# Patient Record
Sex: Female | Born: 1985
Health system: Southern US, Community
[De-identification: ages and names within clinical notes are randomized; demographics above are authoritative.]

## PROBLEM LIST (undated history)

## (undated) DIAGNOSIS — F329 Major depressive disorder, single episode, unspecified: Secondary | ICD-10-CM

## (undated) DIAGNOSIS — F32A Depression, unspecified: Secondary | ICD-10-CM

## (undated) DIAGNOSIS — Z8489 Family history of other specified conditions: Secondary | ICD-10-CM

## (undated) DIAGNOSIS — F1310 Sedative, hypnotic or anxiolytic abuse, uncomplicated: Secondary | ICD-10-CM

## (undated) DIAGNOSIS — Q760 Spina bifida occulta: Secondary | ICD-10-CM

## (undated) DIAGNOSIS — J45909 Unspecified asthma, uncomplicated: Secondary | ICD-10-CM

## (undated) DIAGNOSIS — F419 Anxiety disorder, unspecified: Secondary | ICD-10-CM

## (undated) DIAGNOSIS — K219 Gastro-esophageal reflux disease without esophagitis: Secondary | ICD-10-CM

## (undated) DIAGNOSIS — Q068 Other specified congenital malformations of spinal cord: Secondary | ICD-10-CM

## (undated) HISTORY — PX: SPINE SURGERY: SHX786

## (undated) HISTORY — DX: Other specified congenital malformations of spinal cord: Q06.8

## (undated) HISTORY — DX: Sedative, hypnotic or anxiolytic abuse, uncomplicated: F13.10

## (undated) HISTORY — DX: Spina bifida occulta: Q76.0

## (undated) HISTORY — DX: Gastro-esophageal reflux disease without esophagitis: K21.9

---
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
## 2007-04-07 ENCOUNTER — Emergency Department (HOSPITAL_COMMUNITY): Admission: EM | Admit: 2007-04-07 | Discharge: 2007-04-07 | Payer: Self-pay | Admitting: Family Medicine

## 2007-08-13 ENCOUNTER — Emergency Department (HOSPITAL_COMMUNITY): Admission: EM | Admit: 2007-08-13 | Discharge: 2007-08-13 | Payer: Self-pay | Admitting: Emergency Medicine

## 2008-01-06 ENCOUNTER — Emergency Department (HOSPITAL_COMMUNITY): Admission: EM | Admit: 2008-01-06 | Discharge: 2008-01-06 | Payer: Self-pay | Admitting: Emergency Medicine

## 2008-01-08 ENCOUNTER — Emergency Department (HOSPITAL_COMMUNITY): Admission: EM | Admit: 2008-01-08 | Discharge: 2008-01-08 | Payer: Self-pay | Admitting: Emergency Medicine

## 2009-03-17 ENCOUNTER — Emergency Department (HOSPITAL_COMMUNITY): Admission: EM | Admit: 2009-03-17 | Discharge: 2009-03-17 | Payer: Self-pay | Admitting: Emergency Medicine

## 2011-01-23 LAB — RAPID STREP SCREEN (MED CTR MEBANE ONLY): Streptococcus, Group A Screen (Direct): NEGATIVE

## 2011-07-26 LAB — URINALYSIS, ROUTINE W REFLEX MICROSCOPIC
Bilirubin Urine: NEGATIVE
Ketones, ur: NEGATIVE
Nitrite: NEGATIVE
Protein, ur: NEGATIVE
Urobilinogen, UA: 0.2

## 2011-07-26 LAB — PREGNANCY, URINE: Preg Test, Ur: NEGATIVE

## 2011-07-26 LAB — RPR: RPR Ser Ql: NONREACTIVE

## 2011-07-26 LAB — GC/CHLAMYDIA PROBE AMP, GENITAL: Chlamydia, DNA Probe: NEGATIVE

## 2012-12-10 ENCOUNTER — Other Ambulatory Visit (HOSPITAL_COMMUNITY): Payer: Self-pay | Admitting: Nurse Practitioner

## 2012-12-10 DIAGNOSIS — R109 Unspecified abdominal pain: Secondary | ICD-10-CM

## 2012-12-12 ENCOUNTER — Ambulatory Visit (HOSPITAL_COMMUNITY)
Admission: RE | Admit: 2012-12-12 | Discharge: 2012-12-12 | Disposition: A | Discharge status: Home / Self Care | Payer: Self-pay | Source: Ambulatory Visit | Attending: Obstetrics and Gynecology | Admitting: Obstetrics and Gynecology

## 2012-12-12 ENCOUNTER — Ambulatory Visit (HOSPITAL_COMMUNITY): Payer: Self-pay

## 2012-12-12 ENCOUNTER — Other Ambulatory Visit (HOSPITAL_COMMUNITY): Payer: Self-pay | Admitting: Nurse Practitioner

## 2012-12-12 DIAGNOSIS — R109 Unspecified abdominal pain: Secondary | ICD-10-CM | POA: Insufficient documentation

## 2012-12-12 DIAGNOSIS — N949 Unspecified condition associated with female genital organs and menstrual cycle: Secondary | ICD-10-CM | POA: Insufficient documentation

## 2012-12-19 ENCOUNTER — Encounter (HOSPITAL_COMMUNITY): Payer: Self-pay | Admitting: *Deleted

## 2012-12-19 ENCOUNTER — Emergency Department (HOSPITAL_COMMUNITY)
Admission: EM | Admit: 2012-12-19 | Discharge: 2012-12-19 | Disposition: A | Discharge status: Home / Self Care | Payer: Self-pay | Attending: Emergency Medicine | Admitting: Emergency Medicine

## 2012-12-19 DIAGNOSIS — F411 Generalized anxiety disorder: Secondary | ICD-10-CM | POA: Insufficient documentation

## 2012-12-19 DIAGNOSIS — H938X9 Other specified disorders of ear, unspecified ear: Secondary | ICD-10-CM | POA: Insufficient documentation

## 2012-12-19 DIAGNOSIS — L089 Local infection of the skin and subcutaneous tissue, unspecified: Secondary | ICD-10-CM | POA: Insufficient documentation

## 2012-12-19 DIAGNOSIS — Z79899 Other long term (current) drug therapy: Secondary | ICD-10-CM | POA: Insufficient documentation

## 2012-12-19 DIAGNOSIS — H60392 Other infective otitis externa, left ear: Secondary | ICD-10-CM

## 2012-12-19 DIAGNOSIS — F329 Major depressive disorder, single episode, unspecified: Secondary | ICD-10-CM | POA: Insufficient documentation

## 2012-12-19 DIAGNOSIS — F3289 Other specified depressive episodes: Secondary | ICD-10-CM | POA: Insufficient documentation

## 2012-12-19 HISTORY — DX: Major depressive disorder, single episode, unspecified: F32.9

## 2012-12-19 HISTORY — DX: Anxiety disorder, unspecified: F41.9

## 2012-12-19 HISTORY — DX: Depression, unspecified: F32.A

## 2012-12-19 MED ORDER — SULFAMETHOXAZOLE-TRIMETHOPRIM 800-160 MG PO TABS: 1.0000 | ORAL_TABLET | Freq: Two times a day (BID) | ORAL | Status: DC

## 2012-12-19 MED ORDER — DICLOFENAC SODIUM 75 MG PO TBEC: 75.0000 mg | DELAYED_RELEASE_TABLET | Freq: Two times a day (BID) | ORAL | Status: AC

## 2012-12-19 MED ORDER — KETOROLAC TROMETHAMINE 10 MG PO TABS
10.0000 mg | ORAL_TABLET | Freq: Once | ORAL | Status: AC
  Administered 2012-12-19: 10 mg via ORAL
  Filled 2012-12-19: qty 1

## 2012-12-19 MED ORDER — AMOXICILLIN 500 MG PO CAPS: 500.0000 mg | ORAL_CAPSULE | Freq: Three times a day (TID) | ORAL | Status: DC

## 2012-12-19 MED ORDER — SULFAMETHOXAZOLE-TMP DS 800-160 MG PO TABS
1.0000 | ORAL_TABLET | Freq: Once | ORAL | Status: AC
  Administered 2012-12-19: 1 via ORAL
  Filled 2012-12-19: qty 1

## 2012-12-19 MED ORDER — CEFTRIAXONE SODIUM 1 G IJ SOLR
1.0000 g | Freq: Once | INTRAMUSCULAR | Status: AC
  Administered 2012-12-19: 1 g via INTRAMUSCULAR
  Filled 2012-12-19: qty 10

## 2012-12-19 MED ORDER — ONDANSETRON HCL 4 MG PO TABS
4.0000 mg | ORAL_TABLET | Freq: Once | ORAL | Status: AC
  Administered 2012-12-19: 4 mg via ORAL
  Filled 2012-12-19: qty 1

## 2012-12-19 NOTE — ED Notes (Signed)
Pain/swelling to left earlobe x 2 days.

## 2012-12-19 NOTE — ED Provider Notes (Signed)
History     CSN: [id number]  Arrival date & time 12/19/12  1624   First MD Initiated Contact with Patient 12/19/12 1747      Chief Complaint  Patient presents with  . Otalgia    (Consider location/radiation/quality/duration/timing/severity/associated sxs/prior treatment) HPI Comments: Patient states that she began to notice some swelling of her left earlobe approximately March 3. On March 4 she took an ear ring post out of her ear as well as the backing. She attempted to put it back in on the fifth, and noted even more swelling today. She states she has not noticed any drainage, but has redness and swelling and pain of the earlobe. She does not feel that she has lost a back in the earlobe. She's not had fever or chills related to this.  The history is provided by the patient.    Past Medical History  Diagnosis Date  . Depression   . Anxiety     History reviewed. No pertinent past surgical history.  No family history on file.  History  Substance Use Topics  . Smoking status: Never Smoker   . Smokeless tobacco: Not on file  . Alcohol Use: Yes     Comment: occasional    OB History   Grav Para Term Preterm Abortions TAB SAB Ect Mult Living                  Review of Systems  Constitutional: Negative for activity change.       All ROS Neg except as noted in HPI  HENT: Positive for ear pain. Negative for nosebleeds and neck pain.   Eyes: Negative for photophobia and discharge.  Respiratory: Negative for cough, shortness of breath and wheezing.   Cardiovascular: Negative for chest pain and palpitations.  Gastrointestinal: Negative for abdominal pain and blood in stool.  Genitourinary: Negative for dysuria, frequency and hematuria.  Musculoskeletal: Negative for back pain and arthralgias.  Skin: Negative.   Neurological: Negative for dizziness, seizures and speech difficulty.  Psychiatric/Behavioral: Negative for hallucinations and confusion. The patient is  nervous/anxious.        Depression    Allergies  Review of patient's allergies indicates no known allergies.  Home Medications   Current Outpatient Rx  Name  Route  Sig  Dispense  Refill  . ALPRAZolam (XANAX) 0.5 MG tablet   Oral   Take 0.5 mg by mouth 5 (five) times daily.         Marland Kitchen FLUoxetine (PROZAC) 20 MG capsule   Oral   Take 20 mg by mouth daily.         . phentermine (ADIPEX-P) 37.5 MG tablet   Oral   Take 37.5 mg by mouth daily.         Marland Kitchen amoxicillin (AMOXIL) 500 MG capsule   Oral   Take 1 capsule (500 mg total) by mouth 3 (three) times daily.   21 capsule   0   . diclofenac (VOLTAREN) 75 MG EC tablet   Oral   Take 1 tablet (75 mg total) by mouth 2 (two) times daily.   12 tablet   0   . sulfamethoxazole-trimethoprim (SEPTRA DS) 800-160 MG per tablet   Oral   Take 1 tablet by mouth every 12 (twelve) hours.   14 tablet   0     BP 128/80  Pulse 80  Temp(Src) 98 F (36.7 C) (Oral)  Resp 16  Ht 5\' 7"  (1.702 m)  Wt 230 lb (104.327 kg)  BMI 36.01 kg/m2  SpO2 98%  LMP 11/22/2012  Physical Exam  Nursing note and vitals reviewed. Constitutional: She is oriented to person, place, and time. She appears well-developed and well-nourished.  Non-toxic appearance.  HENT:  Head: Normocephalic.  Right Ear: Tympanic membrane and external ear normal.  Left Ear: Tympanic membrane and external ear normal.  There is redness and swelling of the left earlobe. There is a small amount of crusting on the posterior portion of the lobe. The patient is reluctant to examination, but I do not feel foreign body in the swollen portion of the lobe. There is no swelling or redness behind the ear. There are some swollen nodes in the cervical chain just under the earlobe and extending into the midneck. There is no facial asymmetry. There is no pain or swelling anterior to the left year.  Eyes: EOM and lids are normal. Pupils are equal, round, and reactive to light.  Neck: Normal  range of motion. Neck supple. Carotid bruit is not present.  Cardiovascular: Normal rate, regular rhythm, normal heart sounds, intact distal pulses and normal pulses.   Pulmonary/Chest: Breath sounds normal. No respiratory distress.  Abdominal: Soft. Bowel sounds are normal. There is no tenderness. There is no guarding.  Musculoskeletal: Normal range of motion.  Lymphadenopathy:       Head (right side): No submandibular adenopathy present.       Head (left side): No submandibular adenopathy present.    She has no cervical adenopathy.  Neurological: She is alert and oriented to person, place, and time. She has normal strength. No cranial nerve deficit or sensory deficit.  Skin: Skin is warm and dry.  Psychiatric: She has a normal mood and affect. Her speech is normal.    ED Course  Procedures (including critical care time) PUlse Ox 98% on room air. WNL by my interpretation. Labs Reviewed - No data to display No results found.   1. Infection of skin of ear lobe, left       MDM  . Patient has an infection of the left ear lobe. She was treated in the emergency department with intramuscular Rocephin and Septra. She's given a prescription for all amoxicillin and Septra. She's also given a prescription for diclofenac to assist with swelling and discomfort. The patient is to use warm compresses to the ear. She is advised to see her primary physician, or return to the emergency department if not improving.      Kathie Dike, PA-C 12/19/12 [place]

## 2012-12-19 NOTE — ED Provider Notes (Signed)
Medical screening examination/treatment/procedure(s) were performed by non-physician practitioner and as supervising physician I was immediately available for consultation/collaboration.   Joseph L Zammit, MD 12/19/12 2316 

## 2012-12-19 NOTE — ED Notes (Signed)
Pt had been examined by PA prior to being seen by me.  Alert, NAD

## 2014-12-16 ENCOUNTER — Ambulatory Visit (INDEPENDENT_AMBULATORY_CARE_PROVIDER_SITE_OTHER): Payer: BLUE CROSS/BLUE SHIELD | Admitting: Family Medicine

## 2014-12-16 VITALS — BP 130/80 | HR 86 | Temp 98.7°F | Ht 67.0 in | Wt 249.5 lb

## 2014-12-16 DIAGNOSIS — Z3009 Encounter for other general counseling and advice on contraception: Secondary | ICD-10-CM

## 2014-12-16 MED ORDER — NORETHIN-ETH ESTRAD TRIPHASIC 0.5/0.75/1-35 MG-MCG PO TABS: 1.0000 | ORAL_TABLET | Freq: Every day | ORAL | Status: DC

## 2014-12-16 NOTE — Progress Notes (Signed)
 @[facility] @  This chart was scribed for Hannah SidleKurt Suri Tafolla, MD by Murriel HopperAlec Bankhead, ED Scribe. The patient's care was started at 6:02 PM.   Patient ID: Hannah Lambert MRN: [id number], DOB: August 14, 1986, 29 y.o. Date of Encounter: 12/16/2014, 6:02 PM  Primary Physician: Myriam JacobsonELLIS, DONNA, FNP  Chief Complaint:  Chief Complaint  Patient presents with  . Establish Care    re-establish care  . Contraception    Wants to get started on birth control & have a pap done if needed     HPI: 29 y.o. year old female with history below presents requesting to be prescribed birth control. Pt states she has taken birth control before, and states ortho-tricycline works well. Pt states that she has had high blood pressure in the past, but denies headaches, blood clots, or pain in her legs. 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 Pt notes that she had a pap test two years ago.    Past Medical History  Diagnosis Date  . Depression   . Anxiety      Home Meds: Prior to Admission medications   Medication Sig Start Date End Date Taking? Authorizing Provider  busPIRone (BUSPAR) 5 MG tablet Take 5 mg by mouth 3 (three) times daily.   Yes Historical Provider, MD  diazepam (VALIUM) 5 MG tablet Take 5 mg by mouth daily.   Yes Historical Provider, MD  FLUoxetine (PROZAC) 20 MG capsule Take 20 mg by mouth daily.    Historical Provider, MD    Allergies: No Known Allergies  History   Social History  . Marital Status: Single    Spouse Name: N/A  . Number of Children: N/A  . Years of Education: N/A   Occupational History  . Not on file.   Social History Main Topics  . Smoking status: Current Every Day Smoker  . Smokeless tobacco: Not on file     Comment: 1/4 ppd  . Alcohol Use: 0.0 oz/week    0 Standard drinks or equivalent per week     Comment: occasional  . Drug Use: No  . Sexual Activity: Yes    Birth Control/ Protection: None   Other Topics Concern  . Not on file   Social History  Narrative     Review of Systems: Constitutional: negative for chills, fever, night sweats, weight changes, or fatigue  HEENT: negative for vision changes, hearing loss, congestion, rhinorrhea, ST, epistaxis, or sinus pressure Cardiovascular: negative for chest pain or palpitations Respiratory: negative for hemoptysis, wheezing, shortness of breath, or cough Abdominal: negative for abdominal pain, nausea, vomiting, diarrhea, or constipation Dermatological: negative for rash Neurologic: negative for headache, dizziness, or syncope All other systems reviewed and are otherwise negative with the exception to those above and in the HPI.   Physical Exam: Blood pressure 130/80, pulse 86, temperature 98.7 F (37.1 C), temperature source Oral, height 5\' 7"  (1.702 m), weight 249 lb 8 oz (113.172 kg), last menstrual period 11/22/2014, SpO2 98 %., Body mass index is 39.07 kg/(m^2). General: Well developed, well nourished, in no acute distress. Head: Normocephalic, atraumatic, eyes without discharge, sclera non-icteric, nares are without discharge. Bilateral auditory canals clear, TM's are without perforation, pearly grey and translucent with reflective cone of light bilaterally. Oral cavity moist, posterior pharynx without exudate, erythema, peritonsillar abscess, or post nasal drip.  Neck: Supple. No thyromegaly. Full ROM. No lymphadenopathy. Lungs: Clear bilaterally to auscultation without wheezes, rales, or rhonchi. Breathing is unlabored. Heart: RRR with S1 S2. No murmurs, rubs,  or gallops appreciated. Abdomen: Soft, non-tender, non-distended with normoactive bowel sounds. No hepatomegaly. No rebound/guarding. No obvious abdominal masses. Msk:  Strength and tone normal for age. Extremities/Skin: Warm and dry. No clubbing or cyanosis. No edema. No rashes or suspicious lesions. Neuro: Alert and oriented X 3. Moves all extremities spontaneously. Gait is normal. CNII-XII grossly in tact. Psych:   Responds to questions appropriately with a normal affect.      ASSESSMENT AND PLAN:  29 y.o. year old female with    Signed, Hannah Sidle, MD 12/16/2014 6:02 PM  This chart was scribed in my presence and reviewed by me personally.    ICD-9-CM ICD-10-CM   1. Encounter for other general counseling or advice on contraception V25.09 Z30.09 norethindrone-ethinyl estradiol (ORTHO-NOVUM 7/7/7, 28,) 0.5/0.75/1-35 MG-MCG tablet     DISCONTINUED: norethindrone-ethinyl estradiol (ORTHO-NOVUM 7/7/7, 28,) 0.5/0.75/1-35 MG-MCG tablet     Signed, Hannah Sidle, MD

## 2014-12-16 NOTE — Patient Instructions (Signed)
Contraception Choices Contraception (birth control) is the use of any methods or devices to prevent pregnancy. Below are some methods to help avoid pregnancy. HORMONAL METHODS   Contraceptive implant. This is a thin, plastic tube containing progesterone hormone. It does not contain estrogen hormone. Your health care provider inserts the tube in the inner part of the upper arm. The tube can remain in place for up to 3 years. After 3 years, the implant must be removed. The implant prevents the ovaries from releasing an egg (ovulation), thickens the cervical mucus to prevent sperm from entering the uterus, and thins the lining of the inside of the uterus.  Progesterone-only injections. These injections are given every 3 months by your health care provider to prevent pregnancy. This synthetic progesterone hormone stops the ovaries from releasing eggs. It also thickens cervical mucus and changes the uterine lining. This makes it harder for sperm to survive in the uterus.  Birth control pills. These pills contain estrogen and progesterone hormone. They work by preventing the ovaries from releasing eggs (ovulation). They also cause the cervical mucus to thicken, preventing the sperm from entering the uterus. Birth control pills are prescribed by a health care provider.Birth control pills can also be used to treat heavy periods.  Minipill. This type of birth control pill contains only the progesterone hormone. They are taken every day of each month and must be prescribed by your health care provider.  Birth control patch. The patch contains hormones similar to those in birth control pills. It must be changed once a week and is prescribed by a health care provider.  Vaginal ring. The ring contains hormones similar to those in birth control pills. It is left in the vagina for 3 weeks, removed for 1 week, and then a new one is put back in place. The patient must be comfortable inserting and removing the ring  from the vagina.A health care provider's prescription is necessary.  Emergency contraception. Emergency contraceptives prevent pregnancy after unprotected sexual intercourse. This pill can be taken right after sex or up to 5 days after unprotected sex. It is most effective the sooner you take the pills after having sexual intercourse. Most emergency contraceptive pills are available without a prescription. Check with your pharmacist. Do not use emergency contraception as your only form of birth control. BARRIER METHODS   Female condom. This is a thin sheath (latex or rubber) that is worn over the penis during sexual intercourse. It can be used with spermicide to increase effectiveness.  Female condom. This is a soft, loose-fitting sheath that is put into the vagina before sexual intercourse.  Diaphragm. This is a soft, latex, dome-shaped barrier that must be fitted by a health care provider. It is inserted into the vagina, along with a spermicidal jelly. It is inserted before intercourse. The diaphragm should be left in the vagina for 6 to 8 hours after intercourse.  Cervical cap. This is a round, soft, latex or plastic cup that fits over the cervix and must be fitted by a health care provider. The cap can be left in place for up to 48 hours after intercourse.  Sponge. This is a soft, circular piece of polyurethane foam. The sponge has spermicide in it. It is inserted into the vagina after wetting it and before sexual intercourse.  Spermicides. These are chemicals that kill or block sperm from entering the cervix and uterus. They come in the form of creams, jellies, suppositories, foam, or tablets. They do not require a   prescription. They are inserted into the vagina with an applicator before having sexual intercourse. The process must be repeated every time you have sexual intercourse. INTRAUTERINE CONTRACEPTION  Intrauterine device (IUD). This is a T-shaped device that is put in a woman's uterus  during a menstrual period to prevent pregnancy. There are 2 types:  Copper IUD. This type of IUD is wrapped in copper wire and is placed inside the uterus. Copper makes the uterus and fallopian tubes produce a fluid that kills sperm. It can stay in place for 10 years.  Hormone IUD. This type of IUD contains the hormone progestin (synthetic progesterone). The hormone thickens the cervical mucus and prevents sperm from entering the uterus, and it also thins the uterine lining to prevent implantation of a fertilized egg. The hormone can weaken or kill the sperm that get into the uterus. It can stay in place for 3-5 years, depending on which type of IUD is used. PERMANENT METHODS OF CONTRACEPTION  Female tubal ligation. This is when the woman's fallopian tubes are surgically sealed, tied, or blocked to prevent the egg from traveling to the uterus.  Hysteroscopic sterilization. This involves placing a small coil or insert into each fallopian tube. Your doctor uses a technique called hysteroscopy to do the procedure. The device causes scar tissue to form. This results in permanent blockage of the fallopian tubes, so the sperm cannot fertilize the egg. It takes about 3 months after the procedure for the tubes to become blocked. You must use another form of birth control for these 3 months.  Female sterilization. This is when the female has the tubes that carry sperm tied off (vasectomy).This blocks sperm from entering the vagina during sexual intercourse. After the procedure, the man can still ejaculate fluid (semen). NATURAL PLANNING METHODS  Natural family planning. This is not having sexual intercourse or using a barrier method (condom, diaphragm, cervical cap) on days the woman could become pregnant.  Calendar method. This is keeping track of the length of each menstrual cycle and identifying when you are fertile.  Ovulation method. This is avoiding sexual intercourse during ovulation.  Symptothermal  method. This is avoiding sexual intercourse during ovulation, using a thermometer and ovulation symptoms.  Post-ovulation method. This is timing sexual intercourse after you have ovulated. Regardless of which type or method of contraception you choose, it is important that you use condoms to protect against the transmission of sexually transmitted infections (STIs). Talk with your health care provider about which form of contraception is most appropriate for you. Document Released: 10/02/2005 Document Revised: 10/07/2013 Document Reviewed: 03/27/2013 Exit[employer] Patient Information 2015 [employer]. This information is not intended to replace advice given to you by your health care provider. Make sure you discuss any questions you have with your health care provider.  

## 2014-12-21 ENCOUNTER — Ambulatory Visit (INDEPENDENT_AMBULATORY_CARE_PROVIDER_SITE_OTHER): Payer: BLUE CROSS/BLUE SHIELD | Admitting: Family Medicine

## 2014-12-21 VITALS — BP 130/84 | HR 80 | Temp 98.1°F | Resp 16 | Ht 67.0 in | Wt 253.0 lb

## 2014-12-21 DIAGNOSIS — E663 Overweight: Secondary | ICD-10-CM | POA: Diagnosis not present

## 2014-12-21 DIAGNOSIS — R112 Nausea with vomiting, unspecified: Secondary | ICD-10-CM

## 2014-12-21 DIAGNOSIS — R197 Diarrhea, unspecified: Secondary | ICD-10-CM

## 2014-12-21 LAB — COMPREHENSIVE METABOLIC PANEL
ALT: 30 U/L (ref 0–35)
AST: 15 U/L (ref 0–37)
Albumin: 4.1 g/dL (ref 3.5–5.2)
Alkaline Phosphatase: 63 U/L (ref 39–117)
BUN: 11 mg/dL (ref 6–23)
CO2: 26 meq/L (ref 19–32)
Calcium: 8.7 mg/dL (ref 8.4–10.5)
Chloride: 105 mEq/L (ref 96–112)
Creat: 0.71 mg/dL (ref 0.50–1.10)
GLUCOSE: 90 mg/dL (ref 70–99)
POTASSIUM: 4.2 meq/L (ref 3.5–5.3)
SODIUM: 138 meq/L (ref 135–145)
Total Bilirubin: 0.2 mg/dL (ref 0.2–1.2)
Total Protein: 6.4 g/dL (ref 6.0–8.3)

## 2014-12-21 LAB — POCT CBC
Granulocyte percent: 68.8 %G (ref 37–80)
HEMATOCRIT: 41.2 % (ref 37.7–47.9)
HEMOGLOBIN: 13.2 g/dL (ref 12.2–16.2)
Lymph, poc: 3.4 (ref 0.6–3.4)
MCH: 28.6 pg (ref 27–31.2)
MCHC: 32.1 g/dL (ref 31.8–35.4)
MCV: 89.3 fL (ref 80–97)
MID (cbc): 1 — AB (ref 0–0.9)
MPV: 7.6 fL (ref 0–99.8)
POC Granulocyte: 9.8 — AB (ref 2–6.9)
POC LYMPH %: 24 % (ref 10–50)
POC MID %: 7.2 %M (ref 0–12)
Platelet Count, POC: 282 10*3/uL (ref 142–424)
RBC: 4.62 M/uL (ref 4.04–5.48)
RDW, POC: 14.7 %
WBC: 14.3 10*3/uL — AB (ref 4.6–10.2)

## 2014-12-21 LAB — POCT URINE PREGNANCY: PREG TEST UR: NEGATIVE

## 2014-12-21 MED ORDER — ONDANSETRON 4 MG PO TBDP: 4.0000 mg | ORAL_TABLET | Freq: Three times a day (TID) | ORAL | Status: DC | PRN

## 2014-12-21 NOTE — Patient Instructions (Signed)
Rest and drink plenty of fluids.  Use the zofran as needed for nausea.  Stick to bland foods such as bananas, dry toast, applesauce If you are not improving over the next couple of days please let me know- Sooner if worse.  I will be in touch with the rest of your labs asap

## 2014-12-21 NOTE — Progress Notes (Signed)
[facility] [address] [place] [address] [phone]  Date:  12/21/2014   Name:  BOBBY RAGAN   DOB:  October 12, 1986   MRN:  [id number]  PCP:  Myriam Jacobson, FNP    Chief Complaint: Emesis and Diarrhea   History of Present Illness:  LOANY NEUROTH is a 29 y.o. very pleasant female patient who presents with the following:  Here today with illness since yesterday.  She noted onset of nausea, vomiting and diarrhea that started around 0400 yesterday am.  She is not aware of any suspicious foods.  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   She is feeling a bit better overall today.   She was able to eat breakfast this am, and she is drinking gatorade She last vomited last night around 11pm She is still having diarrhea.   No blood in her diarrhea or vomit.  She is not having abdominal pain She did have chills yesterday and thought that she might have had a fever.  She took an aleve and was able to sleep.    Just started OCP yesterday when her period began  Patient Active Problem List   Diagnosis Date Noted  . Encounter for other general counseling or advice on contraception 12/16/2014    Past Medical History  Diagnosis Date  . Depression   . Anxiety     No past surgical history on file.  History  Substance Use Topics  . Smoking status: Current Every Day Smoker  . Smokeless tobacco: Not on file     Comment: 1/4 ppd  . Alcohol Use: 0.0 oz/week    0 Standard drinks or equivalent per week     Comment: occasional    Family History  Problem Relation Age of Onset  . Hypertension Mother   . Hypertension Father   . Hypertension Maternal Grandmother   . Mental illness Maternal Grandmother   . Mental illness Paternal Grandmother     No Known Allergies  Medication list has been reviewed and updated.  Current Outpatient Prescriptions on File Prior to Visit  Medication Sig Dispense Refill  . busPIRone (BUSPAR)  5 MG tablet Take 5 mg by mouth 3 (three) times daily.    . diazepam (VALIUM) 5 MG tablet Take 5 mg by mouth daily.    Marland Kitchen FLUoxetine (PROZAC) 20 MG capsule Take 20 mg by mouth daily.    . norethindrone-ethinyl estradiol (ORTHO-NOVUM 7/7/7, 28,) 0.5/0.75/1-35 MG-MCG tablet Take 1 tablet by mouth daily. 1 Package 11   No current facility-administered medications on file prior to visit.    Review of Systems:  As per HPI- otherwise negative.   Physical Examination: Filed Vitals:   12/21/14 1536  BP: 130/84  Pulse: 80  Temp: 98.1 F (36.7 C)  Resp: 16   Filed Vitals:   12/21/14 1536  Height:  (1.702 m)  Weight: 253 lb (114.76 kg)   Body mass index is 39.62 kg/(m^2). Ideal Body Weight: Weight in (lb) to have BMI = 25: 159.3  GEN: WDWN, NAD, Non-toxic, A & O x 3, overweight, looks well HEENT: Atraumatic, Normocephalic. Neck supple. No masses, No LAD. Ears and Nose: No external deformity. CV: RRR, No M/G/R. No JVD. No thrill. No extra heart sounds. PULM: CTA B, no wheezes, crackles, rhonchi. No retractions. No resp. distress. No accessory muscle use. ABD: S, NT, ND, +BS. No rebound. No HSM.  Benign exam EXTR: No  c/c/e NEURO Normal gait.  PSYCH: Normally interactive. Conversant. Not depressed or anxious appearing.  Calm demeanor.   Results for orders placed or performed in visit on 12/21/14  POCT CBC  Result Value Ref Range   WBC 14.3 (A) 4.6 - 10.2 K/uL   Lymph, poc 3.4 0.6 - 3.4   POC LYMPH PERCENT 24.0 10 - 50 %L   MID (cbc) 1.0 (A) 0 - 0.9   POC MID % 7.2 0 - 12 %M   POC Granulocyte 9.8 (A) 2 - 6.9   Granulocyte percent 68.8 37 - 80 %G   RBC 4.62 4.04 - 5.48 M/uL   Hemoglobin 13.2 12.2 - 16.2 g/dL   HCT, POC 16.141.2 09.637.7 - 47.9 %   MCV 89.3 80 - 97 fL   MCH, POC 28.6 27 - 31.2 pg   MCHC 32.1 31.8 - 35.4 g/dL   RDW, POC 04.514.7 %   Platelet Count, POC 282 142 - 424 K/uL   MPV 7.6 0 - 99.8 fL  POCT urine pregnancy  Result Value Ref Range   Preg Test, Ur Negative      Assessment and Plan: Non-intractable vomiting with nausea, vomiting of unspecified type - Plan: POCT CBC, Comprehensive metabolic panel, POCT urine pregnancy, ondansetron (ZOFRAN ODT) 4 MG disintegrating tablet, CANCELED: POCT UA - Microscopic Only  Diarrhea  Likely viral gastroenteritis.  Treat with zofran as needed, supportive care.  She will follow-up if not better, check CMP and will be in touch with her.  She does not appear dehydrated and is tolerating PO so did not give IV fluids today  Signed Abbe AmsterdamJessica Rether Rison, MD

## 2014-12-22 ENCOUNTER — Encounter: Payer: Self-pay | Admitting: Family Medicine

## 2015-01-04 ENCOUNTER — Ambulatory Visit (INDEPENDENT_AMBULATORY_CARE_PROVIDER_SITE_OTHER): Payer: BLUE CROSS/BLUE SHIELD | Admitting: Physician Assistant

## 2015-01-04 VITALS — BP 122/82 | HR 74 | Temp 98.1°F | Resp 16 | Ht 67.75 in | Wt 252.4 lb

## 2015-01-04 DIAGNOSIS — Z87898 Personal history of other specified conditions: Secondary | ICD-10-CM

## 2015-01-04 DIAGNOSIS — Z87448 Personal history of other diseases of urinary system: Secondary | ICD-10-CM | POA: Diagnosis not present

## 2015-01-04 LAB — POCT URINALYSIS DIPSTICK
BILIRUBIN UA: NEGATIVE
Glucose, UA: NEGATIVE
KETONES UA: NEGATIVE
NITRITE UA: NEGATIVE
PROTEIN UA: NEGATIVE
Spec Grav, UA: 1.025
Urobilinogen, UA: 0.2
pH, UA: 5.5

## 2015-01-04 LAB — POCT UA - MICROSCOPIC ONLY
Casts, Ur, LPF, POC: NEGATIVE
Crystals, Ur, HPF, POC: NEGATIVE
Mucus, UA: NEGATIVE
Yeast, UA: NEGATIVE

## 2015-01-04 LAB — POCT WET PREP WITH KOH
KOH Prep POC: NEGATIVE
Trichomonas, UA: NEGATIVE
Yeast Wet Prep HPF POC: NEGATIVE

## 2015-01-04 MED ORDER — NITROFURANTOIN MONOHYD MACRO 100 MG PO CAPS: 100.0000 mg | ORAL_CAPSULE | Freq: Two times a day (BID) | ORAL | Status: DC

## 2015-01-04 NOTE — Progress Notes (Signed)
Subjective:    Patient ID: Hannah Lambert, female    DOB: 1986-02-08, 29 y.o.   MRN: [id number]  Chief Complaint  Patient presents with  . work forms    med accom.    Patient Active Problem List   Diagnosis Date Noted  . Overweight 12/21/2014   Prior to Admission medications   Medication Sig Start Date End Date Taking? Authorizing Provider  busPIRone (BUSPAR) 5 MG tablet Take 5 mg by mouth 3 (three) times daily.   Yes Historical Provider, MD  diazepam (VALIUM) 5 MG tablet Take 5 mg by mouth daily.   Yes Historical Provider, MD  FLUoxetine (PROZAC) 20 MG capsule Take 20 mg by mouth daily.   Yes Historical Provider, MD  norethindrone-ethinyl estradiol (ORTHO-NOVUM 7/7/7, 28,) 0.5/0.75/1-35 MG-MCG tablet Take 1 tablet by mouth daily. 12/16/14  Yes [name], MD  ondansetron (ZOFRAN ODT) 4 MG disintegrating tablet Take 1 tablet (4 mg total) by mouth every 8 (eight) hours as needed for nausea or vomiting. 12/21/14  Yes Pearline Cables, MD   Medications, allergies, past medical history, surgical history, family history, social history and problem list reviewed and updated.  HPI  50 yof with pmh freq urination presents needing work form filled out.   She works for [employer] as Scientist, forensic. She is allotted two 15 min breaks per 8 hr shift. She requests that she needs more than this due to urinary freq.   Feels that she has always needed to urinate more freq than most. Has had work forms signed to allow more restroom breaks in the past. Was worked up at [employer] 2012 with normal pelvic per pt. States that she was told to f/u with urology but that she didn't have insurance at the time so never pursued any further workup. Has urinated maybe once every 1-2 hrs. States this is persistent for yrs. Denies dysuria, urgency, hematuria, n/v, diarrhea, fever, chills. Denies vaginal dc, odor, itchiness.   Had CMP 2 wks ago with normal BUN/Cr/glucose.   Review of  Systems No abd pain. No cp, sob.     Objective:   Physical Exam  Constitutional: She is oriented to person, place, and time. She appears well-developed and well-nourished.  Non-toxic appearance. She does not have a sickly appearance. She does not appear ill. No distress.  BP 122/82 mmHg  Pulse 74  Temp(Src) 98.1 F (36.7 C) (Oral)  Resp 16  Ht 5' 7.75" (1.721 m)  Wt 252 lb 6.4 oz (114.488 kg)  BMI 38.65 kg/m2  SpO2 99%  LMP 12/20/2014   Abdominal: Soft. Normal appearance and bowel sounds are normal. There is no tenderness. There is no rigidity, no rebound, no guarding, no CVA tenderness, no tenderness at McBurney's point and negative Murphy's sign.  Neurological: She is alert and oriented to person, place, and time.  Psychiatric: She has a normal mood and affect. Her speech is normal.   Results for orders placed or performed in visit on 01/04/15  POCT urinalysis dipstick  Result Value Ref Range   Color, UA yellow    Clarity, UA clear    Glucose, UA neg    Bilirubin, UA neg    Ketones, UA neg    Spec Grav, UA 1.025    Blood, UA large    pH, UA 5.5    Protein, UA neg    Urobilinogen, UA 0.2    Nitrite, UA neg    Leukocytes, UA small (1+)   POCT  UA - Microscopic Only  Result Value Ref Range   WBC, Ur, HPF, POC 0-6    RBC, urine, microscopic 4-8    Bacteria, U Microscopic trace    Mucus, UA neg    Epithelial cells, urine per micros 0-2    Crystals, Ur, HPF, POC neg    Casts, Ur, LPF, POC neg    Yeast, UA neg   POCT Wet Prep with KOH  Result Value Ref Range   Trichomonas, UA Negative    Clue Cells Wet Prep HPF POC 0-4    Epithelial Wet Prep HPF POC 10-12    Yeast Wet Prep HPF POC neg    Bacteria Wet Prep HPF POC trace    RBC Wet Prep HPF POC 3-4    WBC Wet Prep HPF POC 0-3    KOH Prep POC Negative       Assessment & Plan:   [age] yof with pmh freq urination presents needing work form filled out.   H/O urinary frequency - Plan: POCT urinalysis dipstick, POCT UA -  Microscopic Only, POCT Wet Prep with KOH, Urine culture, nitrofurantoin, macrocrystal-monohydrate, (MACROBID) 100 MG capsule --work form signed for pt to allow more freq restroom breaks --luekocytes on ua, will tx for uti 5 days  --pt states she is spotting today which explains blood -as pt was asx today, instructed pt to rtc one month for ua when not spotting/on menses, if leuks or blood persist will likely refer to urology as pt has had increased urinary freq for yrs, will also refer if freq doesn't improve after macrobid course --clue cells on wet prep but not 50% epithelial cells, pt asx, no tx at this time  Donnajean Lopesodd M. Kiam Bransfield, PA-C Physician Assistant-Certified [facility]  01/05/2015 9:22 PM

## 2015-01-04 NOTE — Patient Instructions (Signed)
We have filled out/signed the form for you. Please let us know if you develop abdominal pain or if your urinary frequency gets worse. We will likely refer you to urology at that time.  You have a UTI on your urine sample today. Please take the macrobid twice daily for 5 days.

## 2015-01-05 LAB — URINE CULTURE
Colony Count: NO GROWTH
Organism ID, Bacteria: NO GROWTH

## 2015-12-19 ENCOUNTER — Emergency Department (HOSPITAL_COMMUNITY)
Admission: EM | Admit: 2015-12-19 | Discharge: 2015-12-19 | Disposition: A | Discharge status: Home / Self Care | Payer: Self-pay | Attending: Emergency Medicine | Admitting: Emergency Medicine

## 2015-12-19 ENCOUNTER — Encounter (HOSPITAL_COMMUNITY): Payer: Self-pay | Admitting: Emergency Medicine

## 2015-12-19 DIAGNOSIS — Z79899 Other long term (current) drug therapy: Secondary | ICD-10-CM | POA: Insufficient documentation

## 2015-12-19 DIAGNOSIS — B349 Viral infection, unspecified: Secondary | ICD-10-CM | POA: Insufficient documentation

## 2015-12-19 DIAGNOSIS — H53149 Visual discomfort, unspecified: Secondary | ICD-10-CM | POA: Insufficient documentation

## 2015-12-19 DIAGNOSIS — Z87891 Personal history of nicotine dependence: Secondary | ICD-10-CM | POA: Insufficient documentation

## 2015-12-19 LAB — COMPREHENSIVE METABOLIC PANEL
ALBUMIN: 4.1 g/dL (ref 3.5–5.0)
ALK PHOS: 58 U/L (ref 38–126)
ALT: 74 U/L — ABNORMAL HIGH (ref 14–54)
ANION GAP: 9 (ref 5–15)
AST: 63 U/L — AB (ref 15–41)
BUN: 11 mg/dL (ref 6–20)
CO2: 26 mmol/L (ref 22–32)
Calcium: 8.5 mg/dL — ABNORMAL LOW (ref 8.9–10.3)
Chloride: 104 mmol/L (ref 101–111)
Creatinine, Ser: 0.81 mg/dL (ref 0.44–1.00)
GFR calc Af Amer: >60 mL/min (ref >60)
GFR calc non Af Amer: >60 mL/min (ref >60)
GLUCOSE: 108 mg/dL — AB (ref 65–99)
POTASSIUM: 3.9 mmol/L (ref 3.5–5.1)
SODIUM: 139 mmol/L (ref 135–145)
Total Bilirubin: 1 mg/dL (ref 0.3–1.2)
Total Protein: 7.6 g/dL (ref 6.5–8.1)

## 2015-12-19 LAB — CBC WITH DIFFERENTIAL/PLATELET
BASOS ABS: 0 10*3/uL (ref 0.0–0.1)
BASOS PCT: 0 %
EOS ABS: 0 10*3/uL (ref 0.0–0.7)
Eosinophils Relative: 1 %
HCT: 44.1 % (ref 36.0–46.0)
HEMOGLOBIN: 14.6 g/dL (ref 12.0–15.0)
LYMPHS ABS: 1.9 10*3/uL (ref 0.7–4.0)
Lymphocytes Relative: 30 %
MCH: 29.4 pg (ref 26.0–34.0)
MCHC: 33.1 g/dL (ref 30.0–36.0)
MCV: 88.7 fL (ref 78.0–100.0)
MONO ABS: 0.7 10*3/uL (ref 0.1–1.0)
MONOS PCT: 10 %
NEUTROS PCT: 59 %
Neutro Abs: 3.7 10*3/uL (ref 1.7–7.7)
Platelets: 226 10*3/uL (ref 150–400)
RBC: 4.97 MIL/uL (ref 3.87–5.11)
RDW: 13.5 % (ref 11.5–15.5)
WBC: 6.3 10*3/uL (ref 4.0–10.5)

## 2015-12-19 MED ORDER — ONDANSETRON HCL 4 MG/2ML IJ SOLN
4.0000 mg | Freq: Once | INTRAMUSCULAR | Status: AC
  Administered 2015-12-19: 4 mg via INTRAVENOUS
  Filled 2015-12-19: qty 2

## 2015-12-19 MED ORDER — SODIUM CHLORIDE 0.9 % IV BOLUS (SEPSIS)
1000.0000 mL | Freq: Once | INTRAVENOUS | Status: AC
  Administered 2015-12-19: 1000 mL via INTRAVENOUS
  Filled 2015-12-19: qty 1000

## 2015-12-19 MED ORDER — KETOROLAC TROMETHAMINE 30 MG/ML IJ SOLN
30.0000 mg | Freq: Once | INTRAMUSCULAR | Status: AC
  Administered 2015-12-19: 30 mg via INTRAVENOUS
  Filled 2015-12-19: qty 1

## 2015-12-19 MED ORDER — PROMETHAZINE HCL 25 MG PO TABS: 25.0000 mg | ORAL_TABLET | Freq: Four times a day (QID) | ORAL | Status: DC | PRN

## 2015-12-19 MED ORDER — TRAMADOL HCL 50 MG PO TABS: 50.0000 mg | ORAL_TABLET | Freq: Four times a day (QID) | ORAL | Status: DC | PRN

## 2015-12-19 MED ORDER — HYDROMORPHONE HCL 1 MG/ML IJ SOLN
1.0000 mg | Freq: Once | INTRAMUSCULAR | Status: AC
  Administered 2015-12-19: 1 mg via INTRAVENOUS
  Filled 2015-12-19: qty 1

## 2015-12-19 NOTE — ED Notes (Signed)
Pt made aware to return if symptoms worsen or if any life threatening symptoms occur.   

## 2015-12-19 NOTE — Discharge Instructions (Signed)
Drink plenty of fluids take Tylenol for any fevers. Follow-up in 1-2 weeks to have liver studies rechecked

## 2015-12-19 NOTE — ED Notes (Signed)
MD at bedside. 

## 2015-12-19 NOTE — ED Notes (Signed)
Patient c/o headache with neck pain. Per patient was coughing three days ago when she felt her "neck pop and get stiff," since then patient has headache with nausea, vomiting, and diarrhea. Patient reports sensitivity to light. Per patient had a fever with flu like symptoms prior to episode with coughing and "neck popping."

## 2015-12-19 NOTE — ED Provider Notes (Signed)
CSN: [id number]     Arrival date & time 12/19/15  0819 History  By signing my name below, I, Budd Palmer, attest that this documentation has been prepared under the direction and in the presence of Bethann Berkshire, MD. Electronically Signed: Budd Palmer, ED Scribe. 12/19/2015. 8:38 AM.     Chief Complaint  Patient presents with  . Headache   Patient is a 30 y.o. female presenting with headaches. The history is provided by the patient. No language interpreter was used.  Headache Pain location:  Generalized Onset quality:  Sudden Duration:  3 days Timing:  Constant Progression:  Unchanged Chronicity:  New Context: coughing   Relieved by:  Nothing Associated symptoms: cough, diarrhea, fever, nausea, neck pain, photophobia and vomiting   Associated symptoms: no abdominal pain, no back pain, no congestion, no fatigue, no myalgias, no numbness, no seizures, no sinus pressure and no weakness   Cough:    Severity:  Mild   Onset quality:  Gradual   Duration:  3 days   Timing:  Intermittent   Chronicity:  New Diarrhea:    Quality:  Semi-solid   Severity:  Mild   Duration:  3 days Fever:    Duration:  3 days   Max temp PTA:  101   Progression:  Unchanged Nausea:    Severity:  Moderate   Onset quality:  Gradual   Duration:  3 days   Timing:  Constant   Progression:  Unchanged Vomiting:    Quality:  Stomach contents   Severity:  Moderate   Duration:  3 days   Timing:  Intermittent   Progression:  Unchanged  HPI Comments: Hannah Lambert is a 30 y.o. female smoker with a PMHX of depression and anxiety who presents to the Emergency Department complaining of headache onset 3 days ago. Pt states she has been sick with flu like symptoms and when she coughed 3 days ago, she felt something "pop" in the left side of her neck, causing pain there and in her head. She reports associated nausea, vomiting, fever (Tmax 101, onset 3 days ago), and diarrhea. She states her most recent episode  of vomiting occurred just after arriving at the ED this morning. She also states she in unable to "keep anything down." She notes she did not get a flu shot this year. Pt denies numbness, weakness, and any other pains.   Past Medical History  Diagnosis Date  . Depression   . Anxiety    History reviewed. No pertinent past surgical history. Family History  Problem Relation Age of Onset  . Hypertension Mother   . Hypertension Father   . Hypertension Maternal Grandmother   . Mental illness Maternal Grandmother   . Mental illness Paternal Grandmother    Social History  Substance Use Topics  . Smoking status: Former Smoker    Types: Cigarettes    Quit date: 07/17/2015  . Smokeless tobacco: Never Used     Comment: 1/4 ppd  . Alcohol Use: 0.0 oz/week    0 Standard drinks or equivalent per week     Comment: occasional   OB History    Gravida Para Term Preterm AB TAB SAB Ectopic Multiple Living       Review of Systems  Constitutional: Positive for fever. Negative for appetite change and fatigue.  HENT: Negative for congestion, ear discharge and sinus pressure.   Eyes: Positive for photophobia. Negative for discharge.  Respiratory:  Positive for cough.   Cardiovascular: Negative for chest pain.  Gastrointestinal: Positive for nausea, vomiting and diarrhea. Negative for abdominal pain.  Genitourinary: Negative for frequency and hematuria.  Musculoskeletal: Positive for neck pain. Negative for myalgias and back pain.  Skin: Negative for rash.  Neurological: Positive for headaches. Negative for seizures, weakness and numbness.  Psychiatric/Behavioral: Negative for hallucinations.    Allergies  Review of patient's allergies indicates no known allergies.  Home Medications   Prior to Admission medications   Medication Sig Start Date End Date Taking? Authorizing Provider  norethindrone-ethinyl estradiol (ORTHO-NOVUM 7/7/7, 28,) 0.5/0.75/1-35 MG-MCG tablet Take 1  tablet by mouth daily. 12/16/14  Yes Elvina SidleKurt Lauenstein, MD  nitrofurantoin, macrocrystal-monohydrate, (MACROBID) 100 MG capsule Take 1 capsule (100 mg total) by mouth 2 (two) times daily. Patient not taking: Reported on 12/19/2015 01/04/15   Raelyn Ensignodd McVeigh, PA  ondansetron (ZOFRAN ODT) 4 MG disintegrating tablet Take 1 tablet (4 mg total) by mouth every 8 (eight) hours as needed for nausea or vomiting. Patient not taking: Reported on 12/19/2015 12/21/14   Gwenlyn FoundJessica C Copland, MD   BP 154/93 mmHg  Pulse 65  Temp(Src) 98.2 F (36.8 C) (Oral)  Resp 20  Ht 5\' 7"  (1.702 m)  Wt 260 lb (117.935 kg)  BMI 40.71 kg/m2  SpO2 97%  LMP 12/15/2015 Physical Exam  Constitutional: She is oriented to person, place, and time. She appears well-developed.  HENT:  Head: Normocephalic.  Eyes: Conjunctivae and EOM are normal. No scleral icterus.  Neck: Neck supple. No thyromegaly present.  Moderate TTP to the left lateral neck  Cardiovascular: Normal rate and regular rhythm.  Exam reveals no gallop and no friction rub.   No murmur heard. Pulmonary/Chest: No stridor. She has no wheezes. She has no rales. She exhibits no tenderness.  Abdominal: She exhibits no distension. There is no tenderness. There is no rebound.  Musculoskeletal: Normal range of motion. She exhibits no edema.  Lymphadenopathy:    She has no cervical adenopathy.  Neurological: She is oriented to person, place, and time. She exhibits normal muscle tone. Coordination normal.  Skin: No rash noted. No erythema.  Psychiatric: She has a normal mood and affect. Her behavior is normal.    ED Course  Procedures  DIAGNOSTIC STUDIES: Oxygen Saturation is 97% on RA, adequate by my interpretation.    COORDINATION OF CARE: 8:31 AM - Discussed probable muscle strain and plans to order a migraine cocktail as well as diagnostic studies. Pt advised of plan for treatment and pt agrees.  Labs Review Labs Reviewed  COMPREHENSIVE METABOLIC PANEL - Abnormal;  Notable for the following:    Glucose, Bld 108 (*)    Calcium 8.5 (*)    AST 63 (*)    ALT 74 (*)    All other components within normal limits  CBC WITH DIFFERENTIAL/PLATELET    Imaging Review No results found. I have personally reviewed and evaluated these images and lab results as part of my medical decision-making.   EKG Interpretation None      MDM   Final diagnoses:  None    Patient with viral gastroenteritis and a muscle strain her neck. She is given prescriptions for Phenergan and Ultram and she is referred to [facility] in [place] to follow-up on the elevated liver studies   The chart was scribed for me under my direct supervision.  I personally performed the history, physical, and medical decision making and all procedures in the evaluation of this patient..Marland Kitchen  Bethann Berkshire, MD 12/19/15 [phone]

## 2015-12-24 ENCOUNTER — Encounter (HOSPITAL_COMMUNITY): Payer: Self-pay | Admitting: Emergency Medicine

## 2015-12-24 ENCOUNTER — Inpatient Hospital Stay (HOSPITAL_COMMUNITY)
Admission: EM | Admit: 2015-12-24 | Discharge: 2015-12-31 | DRG: 418 | Disposition: A | Discharge status: Home / Self Care | Payer: Self-pay | Attending: General Surgery | Admitting: General Surgery

## 2015-12-24 ENCOUNTER — Emergency Department (HOSPITAL_COMMUNITY): Payer: MEDICAID

## 2015-12-24 ENCOUNTER — Emergency Department (HOSPITAL_COMMUNITY): Payer: Self-pay

## 2015-12-24 DIAGNOSIS — K8020 Calculus of gallbladder without cholecystitis without obstruction: Secondary | ICD-10-CM

## 2015-12-24 DIAGNOSIS — R1010 Upper abdominal pain, unspecified: Secondary | ICD-10-CM

## 2015-12-24 DIAGNOSIS — K810 Acute cholecystitis: Secondary | ICD-10-CM

## 2015-12-24 DIAGNOSIS — F418 Other specified anxiety disorders: Secondary | ICD-10-CM | POA: Diagnosis present

## 2015-12-24 DIAGNOSIS — E860 Dehydration: Secondary | ICD-10-CM | POA: Diagnosis present

## 2015-12-24 DIAGNOSIS — R945 Abnormal results of liver function studies: Secondary | ICD-10-CM

## 2015-12-24 DIAGNOSIS — R7401 Elevation of levels of liver transaminase levels: Secondary | ICD-10-CM

## 2015-12-24 DIAGNOSIS — K8000 Calculus of gallbladder with acute cholecystitis without obstruction: Principal | ICD-10-CM | POA: Diagnosis present

## 2015-12-24 DIAGNOSIS — K760 Fatty (change of) liver, not elsewhere classified: Secondary | ICD-10-CM | POA: Diagnosis present

## 2015-12-24 DIAGNOSIS — Z6841 Body Mass Index (BMI) 40.0 and over, adult: Secondary | ICD-10-CM

## 2015-12-24 DIAGNOSIS — D72829 Elevated white blood cell count, unspecified: Secondary | ICD-10-CM

## 2015-12-24 DIAGNOSIS — K819 Cholecystitis, unspecified: Secondary | ICD-10-CM | POA: Diagnosis present

## 2015-12-24 DIAGNOSIS — E440 Moderate protein-calorie malnutrition: Secondary | ICD-10-CM | POA: Diagnosis present

## 2015-12-24 DIAGNOSIS — R740 Nonspecific elevation of levels of transaminase and lactic acid dehydrogenase [LDH]: Secondary | ICD-10-CM

## 2015-12-24 DIAGNOSIS — K8010 Calculus of gallbladder with chronic cholecystitis without obstruction: Secondary | ICD-10-CM

## 2015-12-24 DIAGNOSIS — Z8249 Family history of ischemic heart disease and other diseases of the circulatory system: Secondary | ICD-10-CM

## 2015-12-24 DIAGNOSIS — Z87891 Personal history of nicotine dependence: Secondary | ICD-10-CM

## 2015-12-24 DIAGNOSIS — R7989 Other specified abnormal findings of blood chemistry: Secondary | ICD-10-CM

## 2015-12-24 DIAGNOSIS — F419 Anxiety disorder, unspecified: Secondary | ICD-10-CM | POA: Diagnosis not present

## 2015-12-24 HISTORY — DX: Family history of other specified conditions: Z84.89

## 2015-12-24 HISTORY — DX: Unspecified asthma, uncomplicated: J45.909

## 2015-12-24 LAB — LACTATE DEHYDROGENASE: LDH: 174 U/L (ref 98–192)

## 2015-12-24 LAB — COMPREHENSIVE METABOLIC PANEL
ALBUMIN: 4.8 g/dL (ref 3.5–5.0)
ALT: 308 U/L — ABNORMAL HIGH (ref 14–54)
ANION GAP: 13 (ref 5–15)
AST: 157 U/L — ABNORMAL HIGH (ref 15–41)
Alkaline Phosphatase: 74 U/L (ref 38–126)
BUN: 9 mg/dL (ref 6–20)
CO2: 23 mmol/L (ref 22–32)
Calcium: 9.7 mg/dL (ref 8.9–10.3)
Chloride: 105 mmol/L (ref 101–111)
Creatinine, Ser: 0.74 mg/dL (ref 0.44–1.00)
GFR calc Af Amer: >60 mL/min (ref >60)
GFR calc non Af Amer: >60 mL/min (ref >60)
GLUCOSE: 95 mg/dL (ref 65–99)
POTASSIUM: 3.9 mmol/L (ref 3.5–5.1)
SODIUM: 141 mmol/L (ref 135–145)
TOTAL PROTEIN: 8.6 g/dL — AB (ref 6.5–8.1)
Total Bilirubin: 1.9 mg/dL — ABNORMAL HIGH (ref 0.3–1.2)

## 2015-12-24 LAB — CBC WITH DIFFERENTIAL/PLATELET
BASOS PCT: 0 %
Basophils Absolute: 0 10*3/uL (ref 0.0–0.1)
Eosinophils Absolute: 0.1 10*3/uL (ref 0.0–0.7)
Eosinophils Relative: 1 %
HEMATOCRIT: 48.7 % — AB (ref 36.0–46.0)
HEMOGLOBIN: 16.4 g/dL — AB (ref 12.0–15.0)
LYMPHS ABS: 2.7 10*3/uL (ref 0.7–4.0)
Lymphocytes Relative: 20 %
MCH: 29.1 pg (ref 26.0–34.0)
MCHC: 33.7 g/dL (ref 30.0–36.0)
MCV: 86.3 fL (ref 78.0–100.0)
MONOS PCT: 6 %
Monocytes Absolute: 0.8 10*3/uL (ref 0.1–1.0)
NEUTROS ABS: 9.6 10*3/uL — AB (ref 1.7–7.7)
NEUTROS PCT: 73 %
Platelets: 361 10*3/uL (ref 150–400)
RBC: 5.64 MIL/uL — ABNORMAL HIGH (ref 3.87–5.11)
RDW: 13.2 % (ref 11.5–15.5)
WBC: 13.1 10*3/uL — ABNORMAL HIGH (ref 4.0–10.5)

## 2015-12-24 LAB — MAGNESIUM: Magnesium: 1.6 mg/dL — ABNORMAL LOW (ref 1.7–2.4)

## 2015-12-24 LAB — APTT: APTT: 30 s (ref 24–37)

## 2015-12-24 LAB — URINALYSIS, ROUTINE W REFLEX MICROSCOPIC
Glucose, UA: NEGATIVE mg/dL
Ketones, ur: >80 mg/dL — AB
Leukocytes, UA: NEGATIVE
NITRITE: NEGATIVE
PH: 6.5 (ref 5.0–8.0)
Protein, ur: 30 mg/dL — AB
SPECIFIC GRAVITY, URINE: 1.025 (ref 1.005–1.030)

## 2015-12-24 LAB — URINE MICROSCOPIC-ADD ON

## 2015-12-24 LAB — PREGNANCY, URINE: Preg Test, Ur: NEGATIVE

## 2015-12-24 LAB — PHOSPHORUS: Phosphorus: 3.4 mg/dL (ref 2.5–4.6)

## 2015-12-24 LAB — LIPASE, BLOOD: Lipase: 20 U/L (ref 11–51)

## 2015-12-24 LAB — PROTIME-INR
INR: 1.08 (ref 0.00–1.49)
PROTHROMBIN TIME: 14.2 s (ref 11.6–15.2)

## 2015-12-24 LAB — ETHANOL

## 2015-12-24 LAB — ACETAMINOPHEN LEVEL

## 2015-12-24 MED ORDER — ACETAMINOPHEN 650 MG RE SUPP
650.0000 mg | Freq: Four times a day (QID) | RECTAL | Status: DC | PRN
Start: 2015-12-24 — End: 2015-12-31

## 2015-12-24 MED ORDER — MORPHINE SULFATE (PF) 4 MG/ML IV SOLN
4.0000 mg | Freq: Once | INTRAVENOUS | Status: AC
  Administered 2015-12-24: 4 mg via INTRAVENOUS
  Filled 2015-12-24: qty 1

## 2015-12-24 MED ORDER — ONDANSETRON HCL 4 MG/2ML IJ SOLN
4.0000 mg | Freq: Once | INTRAMUSCULAR | Status: AC
  Administered 2015-12-24: 4 mg via INTRAVENOUS
  Filled 2015-12-24: qty 2

## 2015-12-24 MED ORDER — ACETAMINOPHEN 325 MG PO TABS
650.0000 mg | ORAL_TABLET | Freq: Four times a day (QID) | ORAL | Status: DC | PRN
Start: 2015-12-24 — End: 2015-12-31

## 2015-12-24 MED ORDER — ONDANSETRON HCL 4 MG/2ML IJ SOLN
4.0000 mg | Freq: Four times a day (QID) | INTRAMUSCULAR | Status: DC | PRN
  Administered 2015-12-25 – 2015-12-28 (×10): 4 mg via INTRAVENOUS
  Filled 2015-12-24 (×10): qty 2

## 2015-12-24 MED ORDER — OXYCODONE HCL 5 MG PO TABS
5.0000 mg | ORAL_TABLET | ORAL | Status: DC | PRN
  Administered 2015-12-25 – 2015-12-30 (×7): 5 mg via ORAL
  Filled 2015-12-24 (×8): qty 1

## 2015-12-24 MED ORDER — SODIUM CHLORIDE 0.9 % IV BOLUS (SEPSIS)
1000.0000 mL | Freq: Once | INTRAVENOUS | Status: AC
  Administered 2015-12-24: 1000 mL via INTRAVENOUS

## 2015-12-24 MED ORDER — ONDANSETRON HCL 4 MG PO TABS: 4.0000 mg | ORAL_TABLET | Freq: Four times a day (QID) | ORAL | Status: DC | PRN

## 2015-12-24 MED ORDER — SODIUM CHLORIDE 0.9 % IV SOLN
80.0000 mg | Freq: Once | INTRAVENOUS | Status: AC
  Administered 2015-12-25: 80 mg via INTRAVENOUS
  Filled 2015-12-24: qty 80

## 2015-12-24 MED ORDER — SODIUM CHLORIDE 0.9 % IV SOLN
INTRAVENOUS | Status: DC
  Administered 2015-12-25 (×2): via INTRAVENOUS

## 2015-12-24 MED ORDER — HEPARIN SODIUM (PORCINE) 5000 UNIT/ML IJ SOLN: 5000.0000 [IU] | Freq: Three times a day (TID) | INTRAMUSCULAR | Status: DC

## 2015-12-24 MED ORDER — KETOROLAC TROMETHAMINE 30 MG/ML IJ SOLN
30.0000 mg | Freq: Once | INTRAMUSCULAR | Status: AC
  Administered 2015-12-24: 30 mg via INTRAVENOUS
  Filled 2015-12-24: qty 1

## 2015-12-24 MED ORDER — SODIUM CHLORIDE 0.9 % IV SOLN
3.0000 g | Freq: Four times a day (QID) | INTRAVENOUS | Status: DC
  Administered 2015-12-25 – 2015-12-31 (×27): 3 g via INTRAVENOUS
  Filled 2015-12-24 (×40): qty 3

## 2015-12-24 MED ORDER — SODIUM CHLORIDE 0.9 % IV BOLUS (SEPSIS)
1000.0000 mL | Freq: Once | INTRAVENOUS | Status: AC
Start: 2015-12-24 — End: 2015-12-24
  Administered 2015-12-24: 1000 mL via INTRAVENOUS

## 2015-12-24 MED ORDER — LORAZEPAM 2 MG/ML IJ SOLN: 0.5000 mg | Freq: Four times a day (QID) | INTRAMUSCULAR | Status: DC | PRN

## 2015-12-24 MED ORDER — MORPHINE SULFATE (PF) 4 MG/ML IV SOLN
4.0000 mg | INTRAVENOUS | Status: DC | PRN
  Administered 2015-12-24 – 2015-12-31 (×43): 4 mg via INTRAVENOUS
  Filled 2015-12-24 (×44): qty 1

## 2015-12-24 NOTE — ED Provider Notes (Signed)
CSN: [id number]     Arrival date & time 12/24/15  1257 History   First MD Initiated Contact with Patient 12/24/15 1659     Chief Complaint  Patient presents with  . Abdominal Pain  . Headache     (Consider location/radiation/quality/duration/timing/severity/associated sxs/prior Treatment) HPI Comments: 30 year old female with no significant medical history, recent visit to the emergency department presents for recurrent nausea vomiting general weakness and upper abdominal discomfort for the past 3 days. Family history of gallbladder removal. Phenergan has not helped the patient. Not specifically worse with fatty foods, decreased by mouth intake for the past 2 weeks. No known gallbladder problems. No known ulcers. No blood in the stools.  Patient is a 30 y.o. female presenting with abdominal pain and headaches. The history is provided by the patient.  Abdominal Pain Associated symptoms: nausea and vomiting   Associated symptoms: no chest pain, no chills, no dysuria, no fever and no shortness of breath   Headache Associated symptoms: abdominal pain, nausea and vomiting   Associated symptoms: no back pain, no congestion, no fever, no neck pain and no neck stiffness     Past Medical History  Diagnosis Date  . Depression   . Anxiety    History reviewed. No pertinent past surgical history. Family History  Problem Relation Age of Onset  . Hypertension Mother   . Hypertension Father   . Hypertension Maternal Grandmother   . Mental illness Maternal Grandmother   . Mental illness Paternal Grandmother    Social History  Substance Use Topics  . Smoking status: Former Smoker    Types: Cigarettes    Quit date: 07/17/2015  . Smokeless tobacco: Never Used     Comment: 1/4 ppd  . Alcohol Use: 0.0 oz/week    0 Standard drinks or equivalent per week     Comment: occasional   OB History    Gravida Para Term Preterm AB TAB SAB Ectopic Multiple Living   0 0 0 0 0 0 0 0 0 0      Review of  Systems  Constitutional: Positive for appetite change. Negative for fever and chills.  HENT: Negative for congestion.   Eyes: Negative for visual disturbance.  Respiratory: Negative for shortness of breath.   Cardiovascular: Negative for chest pain.  Gastrointestinal: Positive for nausea, vomiting and abdominal pain.  Genitourinary: Negative for dysuria and flank pain.  Musculoskeletal: Negative for back pain, neck pain and neck stiffness.  Skin: Negative for rash.  Neurological: Positive for headaches. Negative for light-headedness.      Allergies  Review of patient's allergies indicates no known allergies.  Home Medications   Prior to Admission medications   Medication Sig Start Date End Date Taking? Authorizing Provider  promethazine (PHENERGAN) 25 MG tablet Take 1 tablet (25 mg total) by mouth every 6 (six) hours as needed for nausea or vomiting. 12/19/15  Yes Bethann BerkshireJoseph Zammit, MD  traMADol (ULTRAM) 50 MG tablet Take 1 tablet (50 mg total) by mouth every 6 (six) hours as needed. 12/19/15  Yes Bethann BerkshireJoseph Zammit, MD  nitrofurantoin, macrocrystal-monohydrate, (MACROBID) 100 MG capsule Take 1 capsule (100 mg total) by mouth 2 (two) times daily. Patient not taking: Reported on 12/19/2015 01/04/15   Raelyn Ensignodd McVeigh, PA  ondansetron (ZOFRAN ODT) 4 MG disintegrating tablet Take 1 tablet (4 mg total) by mouth every 8 (eight) hours as needed for nausea or vomiting. Patient not taking: Reported on 12/19/2015 12/21/14   Gwenlyn FoundJessica C Copland, MD   BP 137/81 mmHg  Pulse  68  Temp(Src) 98.3 F (36.8 C) (Oral)  Resp 20  Ht 5\' 7"  (1.702 m)  Wt 256 lb 14.4 oz (116.529 kg)  BMI 40.23 kg/m2  SpO2 100%  LMP 12/15/2015 Physical Exam  Constitutional: She is oriented to person, place, and time. She appears well-developed and well-nourished.  HENT:  Head: Normocephalic and atraumatic.  Dry mucous membranes  Eyes: Conjunctivae are normal. Right eye exhibits no discharge. Left eye exhibits no discharge. No scleral  icterus.  Neck: Normal range of motion. Neck supple. No tracheal deviation present.  Cardiovascular: Regular rhythm.  Tachycardia present.   Pulmonary/Chest: Effort normal and breath sounds normal.  Abdominal: Soft. She exhibits no distension. There is tenderness (Right upper quadrant and epigastric). There is no guarding.  Musculoskeletal: She exhibits no edema.  Neurological: She is alert and oriented to person, place, and time.  Skin: Skin is warm. No rash noted.  Psychiatric: She has a normal mood and affect.  Nursing note and vitals reviewed.   ED Course  Procedures (including critical care time) Labs Review Labs Reviewed  COMPREHENSIVE METABOLIC PANEL - Abnormal; Notable for the following:    Total Protein 8.6 (*)    AST 157 (*)    ALT 308 (*)    Total Bilirubin 1.9 (*)    All other components within normal limits  CBC WITH DIFFERENTIAL/PLATELET - Abnormal; Notable for the following:    WBC 13.1 (*)    RBC 5.64 (*)    Hemoglobin 16.4 (*)    HCT 48.7 (*)    Neutro Abs 9.6 (*)    All other components within normal limits  URINALYSIS, ROUTINE W REFLEX MICROSCOPIC (NOT AT [facility]) - Abnormal; Notable for the following:    Color, Urine ORANGE (*)    Hgb urine dipstick TRACE (*)    Bilirubin Urine MODERATE (*)    Ketones, ur >80 (*)    Protein, ur 30 (*)    All other components within normal limits  URINE MICROSCOPIC-ADD ON - Abnormal; Notable for the following:    Squamous Epithelial / LPF 0-5 (*)    Bacteria, UA RARE (*)    All other components within normal limits  CBC - Abnormal; Notable for the following:    WBC 11.2 (*)    All other components within normal limits  COMPREHENSIVE METABOLIC PANEL - Abnormal; Notable for the following:    Calcium 8.3 (*)    AST 90 (*)    ALT 214 (*)    Total Bilirubin 1.8 (*)    All other components within normal limits  ACETAMINOPHEN LEVEL - Abnormal; Notable for the following:    Acetaminophen (Tylenol), Serum <10 (*)    All other  components within normal limits  MAGNESIUM - Abnormal; Notable for the following:    Magnesium 1.6 (*)    All other components within normal limits  CULTURE, BLOOD (ROUTINE X 2)  CULTURE, BLOOD (ROUTINE X 2)  LIPASE, BLOOD  PREGNANCY, URINE  APTT  PROTIME-INR  ETHANOL  LACTATE DEHYDROGENASE  PHOSPHORUS  TSH  HEMOGLOBIN A1C  HEPATITIS PANEL, ACUTE    Imaging Review US Abdomen Complete  12/24/2015  CLINICAL DATA:  Elevated LFTs with epigastric pain. EXAM: ABDOMEN ULTRASOUND COMPLETE COMPARISON:  None. FINDINGS: Gallbladder: Difficult to visualize secondary to poor acoustic through transmission in the liver parenchyma and overlying bowel gas. Layering sludge noted. Possible tiny gallstones, but not definite. Gallbladder wall is upper normal for thickness. No pericholecystic fluid. The sonographer reports no sonographic Murphy sign.  Common bile duct: Diameter: 3 mm Liver: Increased echogenicity with poor through transmission, likely related with fatty deposition. No focal abnormality identified. IVC: No abnormality visualized. Pancreas: Obscured by overlying bowel gas. Spleen: Size and appearance within normal limits. Right Kidney: Length: 11.8 cm. Echogenicity within normal limits. No mass or hydronephrosis visualized. Left Kidney: Length: 11.9 cm. Echogenicity within normal limits. No mass or hydronephrosis visualized. Abdominal aorta: Portions obscured by bowel gas. No aneurysm identified. Other findings: None. IMPRESSION: Technically difficult study secondary to body habitus. Gallbladder sludge with potential tiny gallstones. Gallbladder wall thickness is upper normal. No sonographic Murphy sign. No biliary dilatation. Electronically Signed   By: Kennith Center M.D.   On: 12/24/2015 19:01   I have personally reviewed and evaluated these images and lab results as part of my medical decision-making.   EKG Interpretation None      MDM   Final diagnoses:  LFT elevation  Upper abdominal  pain  Acute cholecystitis   Patient presents with recurrent upper abdominal pain vomiting and general weakness. Patient clinically dehydrated. LFTs elevated in bilirubin in the urine with total bilirubin of 1.9. Plan for formal ultrasound.  Paged G surgery, non response.  Clinical concern for cholecystitis with white blood cell elevation, fever home, right upper quadrant tenderness, LFT elevation and borderline wall thickening on formal ultrasound. Discussed with [facility] for admission/observation for fluids, pain control and surgery consult.  The patients results and plan were reviewed and discussed.   Any x-rays performed were independently reviewed by myself.   Differential diagnosis were considered with the presenting HPI.  Medications  oxyCODONE (Oxy IR/ROXICODONE) immediate release tablet 5 mg (not administered)  acetaminophen (TYLENOL) tablet 650 mg (not administered)    Or  acetaminophen (TYLENOL) suppository 650 mg (not administered)  morphine 4 MG/ML injection 4 mg (4 mg Intravenous Given 12/25/15 1522)  ondansetron (ZOFRAN) tablet 4 mg (not administered)    Or  ondansetron (ZOFRAN) injection 4 mg (not administered)  Ampicillin-Sulbactam (UNASYN) 3 g in sodium chloride 0.9 % 100 mL IVPB (3 g Intravenous Given 12/25/15 1519)  enoxaparin (LOVENOX) injection 40 mg (40 mg Subcutaneous Not Given 12/25/15 1100)  LORazepam (ATIVAN) injection 1 mg (not administered)  dextrose 5 % and 0.45 % NaCl with KCl 20 mEq/L infusion ( Intravenous New Bag/Given 12/25/15 1537)  sodium chloride 0.9 % bolus 1,000 mL (0 mLs Intravenous Stopped 12/24/15 2111)  morphine 4 MG/ML injection 4 mg (4 mg Intravenous Given 12/24/15 1904)  ondansetron (ZOFRAN) injection 4 mg (4 mg Intravenous Given 12/24/15 1904)  sodium chloride 0.9 % bolus 1,000 mL (1,000 mLs Intravenous New Bag/Given 12/24/15 2111)  ketorolac (TORADOL) 30 MG/ML injection 30 mg (30 mg Intravenous Given 12/24/15 2351)  pantoprazole  (PROTONIX) 80 mg in sodium chloride 0.9 % 100 mL IVPB (80 mg Intravenous Given 12/25/15 0136)  magnesium sulfate IVPB 2 g 50 mL (2 g Intravenous Given 12/25/15 1611)    Filed Vitals:   12/24/15 1911 12/24/15 2204 12/25/15 0536 12/25/15 1401  BP: 139/97 155/103 122/81 137/81  Pulse: 75 75 66 68  Temp: 98.4 F (36.9 C) 98.7 F (37.1 C) 98.3 F (36.8 C) 98.3 F (36.8 C)  TempSrc: Oral Oral Oral Oral  Resp: Height:   (1.702 m)    Weight:  256 lb 14.4 oz (116.529 kg)    SpO2: 96% 96% 100% 100%    Final diagnoses:  LFT elevation  Upper abdominal pain  Acute cholecystitis  Admission/ observation were discussed with the admitting physician, patient and/or family and they are comfortable with the plan.     Blane Ohara, MD 12/25/15 [phone]

## 2015-12-24 NOTE — H&P (Signed)
[facility] History and Physical  Mertha FindersBrooke N Recine WUJ:[id number] DOB: March 23, 1986 DOA: 12/24/2015  Referring physician: Dr Gardiner RhymeZavits - [facility] PCP: No PCP Per Patient   Chief Complaint: Abd pain and HA  HPI: Hannah Lambert is a 30 y.o. female  2 Weeks of N/V and upper abd pain. Generalized weakness. Phenergan w/o relief. Getting worse. Intermittent but now constant. Worse with meals. Intermittent fevers up to 101. Abdominal pain is worse with deep respirations but denies any cough chest pain runny nose or upper respiratory congestion.   Review of Systems:  Constitutional:  No night sweats, , chills,.  HEENT:  No headaches, Difficulty swallowing,Tooth/dental problems,Sore throat, Cardio-vascular:  No chest pain, Orthopnea, PND, swelling in lower extremities, anasarca, dizziness, palpitations  GI: Pwer HPI Resp:   No shortness of breath with exertion or at rest. No excess mucus, no productive cough, No non-productive cough, No coughing up of blood.No change in color of mucus.No wheezing.No chest wall deformity  Skin:  no rash or lesions.  GU:  no dysuria, change in color of urine, no urgency or frequency. No flank pain.  Musculoskeletal:   No joint pain or swelling. No decreased range of motion. No back pain.  Psych:  No change in mood or affect. No depression or anxiety. No memory loss.  Neuro:  No change in sensation, unilateral strength, or cognitive abilities  All other systems were reviewed and are negative.  Past Medical History  Diagnosis Date  . Depression   . Anxiety    History reviewed. No pertinent past surgical history. Social History:  reports that she quit smoking about 5 months ago. Her smoking use included Cigarettes. She has never used smokeless tobacco. She reports that she drinks alcohol. She reports that she does not use illicit drugs.  No Known Allergies  Family History  Problem Relation Age of Onset  . Hypertension Mother   . Hypertension  Father   . Hypertension Maternal Grandmother   . Mental illness Maternal Grandmother   . Mental illness Paternal Grandmother      Prior to Admission medications   Medication Sig Start Date End Date Taking? Authorizing Provider  promethazine (PHENERGAN) 25 MG tablet Take 1 tablet (25 mg total) by mouth every 6 (six) hours as needed for nausea or vomiting. 12/19/15  Yes Bethann BerkshireJoseph Zammit, MD  traMADol (ULTRAM) 50 MG tablet Take 1 tablet (50 mg total) by mouth every 6 (six) hours as needed. 12/19/15  Yes Bethann BerkshireJoseph Zammit, MD  nitrofurantoin, macrocrystal-monohydrate, (MACROBID) 100 MG capsule Take 1 capsule (100 mg total) by mouth 2 (two) times daily. Patient not taking: Reported on 12/19/2015 01/04/15   Raelyn Ensignodd McVeigh, PA  ondansetron (ZOFRAN ODT) 4 MG disintegrating tablet Take 1 tablet (4 mg total) by mouth every 8 (eight) hours as needed for nausea or vomiting. Patient not taking: Reported on 12/19/2015 12/21/14   Pearline CablesJessica C Copland, MD   Physical Exam: Filed Vitals:   12/24/15 1345 12/24/15 1911  BP: 145/100 139/97  Pulse: 107 75  Temp: 98 F (36.7 C) 98.4 F (36.9 C)  TempSrc:  Oral  Resp: 18 20  Height: 5\' 7"  (1.702 m)   Weight: 116.257 kg (256 lb 4.8 oz)   SpO2: 100% 96%    Wt Readings from Last 3 Encounters:  12/24/15 116.257 kg (256 lb 4.8 oz)  12/19/15 117.935 kg (260 lb)  01/04/15 114.488 kg (252 lb 6.4 oz)    General:  Appears calm and comfortable Eyes:  PERRL, EOMI, normal lids, iris  ENT:  grossly normal hearing, lips & tongue Neck:  no LAD, masses or thyromegaly Cardiovascular:  RRR, no m/r/g. No LE edema.  Respiratory:  CTA bilaterally, no w/r/r. Normal respiratory effort. Abdomen:  Obese, soft, positive Murphy sign, nontender at McBurney's point, hypoactive bowel sounds Skin:  no rash or induration seen on limited exam Musculoskeletal:  grossly normal tone BUE/BLE Psychiatric:  grossly normal mood and affect, speech fluent and appropriate Neurologic:  CN 2-12 grossly intact,  moves all extremities in coordinated fashion.          Labs on Admission:  Basic Metabolic Panel:  Recent Labs Lab 12/19/15 0855 12/24/15 1422  NA 139 141  K 3.9 3.9  CL 104 105  CO2 26 23  GLUCOSE 108* 95  BUN 11 9  CREATININE 0.81 0.74  CALCIUM 8.5* 9.7   Liver Function Tests:  Recent Labs Lab 12/19/15 0855 12/24/15 1422  AST 63* 157*  ALT 74* 308*  ALKPHOS 58 74  BILITOT 1.0 1.9*  PROT 7.6 8.6*  ALBUMIN 4.1 4.8    Recent Labs Lab 12/24/15 1422  LIPASE 20   No results for input(s): AMMONIA in the last 168 hours. CBC:  Recent Labs Lab 12/19/15 0855 12/24/15 1422  WBC 6.3 13.1*  NEUTROABS 3.7 9.6*  HGB 14.6 16.4*  HCT 44.1 48.7*  MCV 88.7 86.3  PLT 226 361   Cardiac Enzymes: No results for input(s): CKTOTAL, CKMB, CKMBINDEX, TROPONINI in the last 168 hours.  BNP (last 3 results) No results for input(s): BNP in the last 8760 hours.  ProBNP (last 3 results) No results for input(s): PROBNP in the last 8760 hours.   CREATININE: 0.74 (12/24/15 1422) Estimated creatinine clearance - 139.2 mL/min  CBG: No results for input(s): GLUCAP in the last 168 hours.  Radiological Exams on Admission: US Abdomen Complete  12/24/2015  CLINICAL DATA:  Elevated LFTs with epigastric pain. EXAM: ABDOMEN ULTRASOUND COMPLETE COMPARISON:  None. FINDINGS: Gallbladder: Difficult to visualize secondary to poor acoustic through transmission in the liver parenchyma and overlying bowel gas. Layering sludge noted. Possible tiny gallstones, but not definite. Gallbladder wall is upper normal for thickness. No pericholecystic fluid. The sonographer reports no sonographic Murphy sign. Common bile duct: Diameter: 3 mm Liver: Increased echogenicity with poor through transmission, likely related with fatty deposition. No focal abnormality identified. IVC: No abnormality visualized. Pancreas: Obscured by overlying bowel gas. Spleen: Size and appearance within normal limits. Right Kidney:  Length: 11.8 cm. Echogenicity within normal limits. No mass or hydronephrosis visualized. Left Kidney: Length: 11.9 cm. Echogenicity within normal limits. No mass or hydronephrosis visualized. Abdominal aorta: Portions obscured by bowel gas. No aneurysm identified. Other findings: None. IMPRESSION: Technically difficult study secondary to body habitus. Gallbladder sludge with potential tiny gallstones. Gallbladder wall thickness is upper normal. No sonographic Murphy sign. No biliary dilatation. Electronically Signed   By: Kennith Center M.D.   On: 12/24/2015 19:01       Assessment/Plan Active Problems:   Acute cholecystitis   Leukocytosis   Transaminitis   Depression with anxiety   Gallstones  Acute cholecystitis: Ultrasound somewhat limited due to body habitus but gallbladder sludge and potential small stones appreciated. Gallbladder wall thickness at upper level of normal. AST 157, ALT 308, total bilirubin 1.9, lipase 20, WBC 13.1, AF VSS. Positive Murphy sign on exam.  - MedSurg - General surgery, Dr. Lovell Sheehan, consult by EDP and awaiting input. Anticipate cholecystectomy in the next 1-2 days - Unasyn, BCX - IVF - Clear liquid  diet then NPO after midnight - EKG, Coags - CMET in am to trend biliary abnormalities  Depression/Anxiety: no home medications. Well compensated. Excellent support at this time. Somewhat anxious - Low dose Ativan PRN   Code Status: FULL  DVT Prophylaxis: Hep x1 then SCD Family Communication:  Significant other - BF? Disposition Plan: Pending Improvement    Steel Kerney Shela Commons, MD Family Medicine [facility] [email or web address] Password [email or web address]

## 2015-12-24 NOTE — ED Notes (Addendum)
Patient reports headache, nausea, vomiting, generalized weakness, abdominal swelling. Reports abdominal swelling has been going on for several days. Patient anxious. States she was seen here on the fifth and given phenergan which has not helped and she is unable to keep anything down.

## 2015-12-25 DIAGNOSIS — E440 Moderate protein-calorie malnutrition: Secondary | ICD-10-CM | POA: Insufficient documentation

## 2015-12-25 LAB — CBC
HCT: 41.4 % (ref 36.0–46.0)
Hemoglobin: 13.6 g/dL (ref 12.0–15.0)
MCH: 28.9 pg (ref 26.0–34.0)
MCHC: 32.9 g/dL (ref 30.0–36.0)
MCV: 87.9 fL (ref 78.0–100.0)
Platelets: 274 10*3/uL (ref 150–400)
RBC: 4.71 MIL/uL (ref 3.87–5.11)
RDW: 13.4 % (ref 11.5–15.5)
WBC: 11.2 10*3/uL — ABNORMAL HIGH (ref 4.0–10.5)

## 2015-12-25 LAB — COMPREHENSIVE METABOLIC PANEL
ALBUMIN: 3.8 g/dL (ref 3.5–5.0)
ALT: 214 U/L — ABNORMAL HIGH (ref 14–54)
ANION GAP: 8 (ref 5–15)
AST: 90 U/L — ABNORMAL HIGH (ref 15–41)
Alkaline Phosphatase: 60 U/L (ref 38–126)
BUN: 10 mg/dL (ref 6–20)
CALCIUM: 8.3 mg/dL — AB (ref 8.9–10.3)
CO2: 24 mmol/L (ref 22–32)
Chloride: 109 mmol/L (ref 101–111)
Creatinine, Ser: 0.68 mg/dL (ref 0.44–1.00)
GFR calc non Af Amer: >60 mL/min (ref >60)
GLUCOSE: 81 mg/dL (ref 65–99)
POTASSIUM: 3.5 mmol/L (ref 3.5–5.1)
SODIUM: 141 mmol/L (ref 135–145)
TOTAL PROTEIN: 6.6 g/dL (ref 6.5–8.1)
Total Bilirubin: 1.8 mg/dL — ABNORMAL HIGH (ref 0.3–1.2)

## 2015-12-25 LAB — TSH: TSH: 1.378 u[IU]/mL (ref 0.350–4.500)

## 2015-12-25 MED ORDER — SODIUM CHLORIDE 0.9 % IV SOLN
INTRAVENOUS | Status: AC
  Filled 2015-12-25 (×2): qty 3

## 2015-12-25 MED ORDER — LORAZEPAM 2 MG/ML IJ SOLN
1.0000 mg | INTRAMUSCULAR | Status: DC | PRN
  Administered 2015-12-28 – 2015-12-31 (×15): 1 mg via INTRAVENOUS
  Filled 2015-12-25 (×16): qty 1

## 2015-12-25 MED ORDER — KCL IN DEXTROSE-NACL 20-5-0.45 MEQ/L-%-% IV SOLN
INTRAVENOUS | Status: DC
  Administered 2015-12-25 – 2015-12-28 (×4): via INTRAVENOUS

## 2015-12-25 MED ORDER — ENOXAPARIN SODIUM 40 MG/0.4ML ~~LOC~~ SOLN
40.0000 mg | SUBCUTANEOUS | Status: DC
Start: 2015-12-25 — End: 2015-12-31
  Filled 2015-12-25 (×2): qty 0.4

## 2015-12-25 MED ORDER — PANTOPRAZOLE SODIUM 40 MG IV SOLR
INTRAVENOUS | Status: AC
  Filled 2015-12-25: qty 80

## 2015-12-25 MED ORDER — MAGNESIUM SULFATE 2 GM/50ML IV SOLN
2.0000 g | Freq: Once | INTRAVENOUS | Status: AC
  Administered 2015-12-25: 2 g via INTRAVENOUS
  Filled 2015-12-25: qty 50

## 2015-12-25 NOTE — Progress Notes (Signed)
PROGRESS NOTE  Hannah Lambert UJW:[id number] DOB: Aug 08, 1986 DOA: 12/24/2015 PCP: No PCP Per Patient  Assessment/Plan: Acute cholecystitis:  -Ultrasound somewhat limited due to body habitus but gallbladder sludge and potential small stones appreciated. Gallbladder wall thickness at upper level of normal. AST 157, ALT 308, total bilirubin 1.9, lipase 20, WBC 13.1, AF VSS. Positive Murphy sign on exam.  - appreciate: Dr. Lovell Sheehan: Plan: Continue IV pain control, IV hydration. Would continue IV Unasyn as ordered. Will reevaluate in a.m. Anticipate surgery in the next 48 hours. Will start clear liquid diet. - Unasyn, BCX - IVF - Clear liquid diet  Depression/Anxiety: no home medications. Well compensated. Excellent support at this time. Somewhat anxious - Low dose Ativan PRN  hypomagnesium -replete  Code Status: full Family Communication:  Disposition Plan:    Consultants:  surgery  Procedures:      HPI/Subjective: Pain better  Objective: Filed Vitals:   12/24/15 2204 12/25/15 0536  BP: 155/103 122/81  Pulse: 75 66  Temp: 98.7 F (37.1 C) 98.3 F (36.8 C)  Resp: 20 20    Intake/Output Summary (Last 24 hours) at 12/25/15 1008 Last data filed at 12/25/15 0550  Gross per 24 hour  Intake    240 ml  Output      2 ml  Net    238 ml   Filed Weights   12/24/15 1345 12/24/15 2204  Weight: 116.257 kg (256 lb 4.8 oz) 116.529 kg (256 lb 14.4 oz)    Exam:   General:  awake  Cardiovascular: rrr  Respiratory: clear  Abdomen: +BS, min tenderness  Musculoskeletal: no edema   Data Reviewed: Basic Metabolic Panel:  Recent Labs Lab 12/19/15 0855 12/24/15 1422 12/24/15 2238 12/25/15 0616  NA 139 141  --  141  K 3.9 3.9  --  3.5  CL 104 105  --  109  CO2 26 23  --  24  GLUCOSE 108* 95  --  81  BUN 11 9  --  10  CREATININE 0.81 0.74  --  0.68  CALCIUM 8.5* 9.7  --  8.3*  MG  --   --  1.6*  --   PHOS  --   --  3.4  --    Liver Function  Tests:  Recent Labs Lab 12/19/15 0855 12/24/15 1422 12/25/15 0616  AST 63* 157* 90*  ALT 74* 308* 214*  ALKPHOS 58 74 60  BILITOT 1.0 1.9* 1.8*  PROT 7.6 8.6* 6.6  ALBUMIN 4.1 4.8 3.8    Recent Labs Lab 12/24/15 1422  LIPASE 20   No results for input(s): AMMONIA in the last 168 hours. CBC:  Recent Labs Lab 12/19/15 0855 12/24/15 1422 12/25/15 0616  WBC 6.3 13.1* 11.2*  NEUTROABS 3.7 9.6*  --   HGB 14.6 16.4* 13.6  HCT 44.1 48.7* 41.4  MCV 88.7 86.3 87.9  PLT 226 361 274   Cardiac Enzymes: No results for input(s): CKTOTAL, CKMB, CKMBINDEX, TROPONINI in the last 168 hours. BNP (last 3 results) No results for input(s): BNP in the last 8760 hours.  ProBNP (last 3 results) No results for input(s): PROBNP in the last 8760 hours.  CBG: No results for input(s): GLUCAP in the last 168 hours.  Recent Results (from the past 240 hour(s))  Culture, blood (routine x 2)     Status: None (Preliminary result)   Collection Time: 12/24/15 10:38 PM  Result Value Ref Range Status   Specimen Description LEFT ANTECUBITAL  Final   Special  Requests BOTTLES DRAWN AEROBIC AND ANAEROBIC 4CC EACH  Final   Culture PENDING  Incomplete   Report Status PENDING  Incomplete  Culture, blood (routine x 2)     Status: None (Preliminary result)   Collection Time: 12/24/15 10:49 PM  Result Value Ref Range Status   Specimen Description BLOOD LEFT HAND  Final   Special Requests BOTTLES DRAWN AEROBIC AND ANAEROBIC 4CC EACH  Final   Culture PENDING  Incomplete   Report Status PENDING  Incomplete     Studies: [place] Abdomen Complete  12/24/2015  CLINICAL DATA:  Elevated LFTs with epigastric pain. EXAM: ABDOMEN ULTRASOUND COMPLETE COMPARISON:  None. FINDINGS: Gallbladder: Difficult to visualize secondary to poor acoustic through transmission in the liver parenchyma and overlying bowel gas. Layering sludge noted. Possible tiny gallstones, but not definite. Gallbladder wall is upper normal for  thickness. No pericholecystic fluid. The sonographer reports no sonographic Murphy sign. Common bile duct: Diameter: 3 mm Liver: Increased echogenicity with poor through transmission, likely related with fatty deposition. No focal abnormality identified. IVC: No abnormality visualized. Pancreas: Obscured by overlying bowel gas. Spleen: Size and appearance within normal limits. Right Kidney: Length: 11.8 cm. Echogenicity within normal limits. No mass or hydronephrosis visualized. Left Kidney: Length: 11.9 cm. Echogenicity within normal limits. No mass or hydronephrosis visualized. Abdominal aorta: Portions obscured by bowel gas. No aneurysm identified. Other findings: None. IMPRESSION: Technically difficult study secondary to body habitus. Gallbladder sludge with potential tiny gallstones. Gallbladder wall thickness is upper normal. No sonographic Murphy sign. No biliary dilatation. Electronically Signed   By: Kennith CenterEric  Mansell M.D.   On: 12/24/2015 19:01    Scheduled Meds: . ampicillin-sulbactam (UNASYN) IV  3 g Intravenous Q6H  . enoxaparin (LOVENOX) injection  40 mg Subcutaneous Q24H   Continuous Infusions: . dextrose 5 % and 0.45 % NaCl with KCl 20 mEq/L     Antibiotics Given (last 72 hours)    Date/Time Action Medication Dose Rate   12/25/15 0300 Given   Ampicillin-Sulbactam (UNASYN) 3 g in sodium chloride 0.9 % 100 mL IVPB 3 g 100 mL/hr   12/25/15 0941 Given   Ampicillin-Sulbactam (UNASYN) 3 g in sodium chloride 0.9 % 100 mL IVPB 3 g 100 mL/hr      Active Problems:   Acute cholecystitis   Leukocytosis   Transaminitis   Depression with anxiety   Gallstones   Cholecystitis   Malnutrition of moderate degree    Time spent: 25 min    Hannah Lambert U [facility] Inc[facility]ANN  [facility] Pager [phone]. If 7PM-7AM, please contact night-coverage at [email or web address], password [email or web address] 12/25/2015, 10:08 AM  LOS: 1 day

## 2015-12-25 NOTE — Consult Note (Signed)
Reason for Consult: Cholecystitis, cholelithiasis Referring Physician: Hospitalist Hannah Lambert is an 30 y.o. female.  HPI:  patient is a 30 year old white female who has had a two-week history of intermittent nausea and vomiting. She presented to the emergency room twice. On her second visit, she was noted to have elevated liver enzyme tests. Ultrasound of the gallbladder revealed small stones but no pericholecystic fluid. Examination was limited secondary to body habitus. Patient states her pain is somewhat better with IV pain medications. Less nausea is noted.   Past Medical History  Diagnosis Date  . Depression   . Anxiety     History reviewed. No pertinent past surgical history.  Family History  Problem Relation Age of Onset  . Hypertension Mother   . Hypertension Father   . Hypertension Maternal Grandmother   . Mental illness Maternal Grandmother   . Mental illness Paternal Grandmother     Social History:  reports that she quit smoking about 5 months ago. Her smoking use included Cigarettes. She has never used smokeless tobacco. She reports that she drinks alcohol. She reports that she does not use illicit drugs.  Allergies: No Known Allergies  Medications: I have reviewed the patient's current medications.  Results for orders placed or performed during the hospital encounter of 12/24/15 (from the past 48 hour(s))  Comprehensive metabolic panel     Status: Abnormal   Collection Time: 12/24/15  2:22 PM  Result Value Ref Range   Sodium 141 135 - 145 mmol/L   Potassium 3.9 3.5 - 5.1 mmol/L   Chloride 105 101 - 111 mmol/L   CO2 23 22 - 32 mmol/L   Glucose, Bld 95 65 - 99 mg/dL   BUN 9 6 - 20 mg/dL   Creatinine, Ser 0.74 0.44 - 1.00 mg/dL   Calcium 9.7 8.9 - 10.3 mg/dL   Total Protein 8.6 (H) 6.5 - 8.1 g/dL   Albumin 4.8 3.5 - 5.0 g/dL   AST 157 (H) 15 - 41 U/L   ALT 308 (H) 14 - 54 U/L   Alkaline Phosphatase 74 38 - 126 U/L   Total Bilirubin 1.9 (H) 0.3 - 1.2  mg/dL   GFR calc non Af Amer >60 >60 mL/min   GFR calc Af Amer >60 >60 mL/min    Comment: (NOTE) The eGFR has been calculated using the CKD EPI equation. This calculation has not been validated in all clinical situations. eGFR's persistently <60 mL/min signify possible Chronic Kidney Disease.    Anion gap 13 5 - 15  Lipase, blood     Status: None   Collection Time: 12/24/15  2:22 PM  Result Value Ref Range   Lipase 20 11 - 51 U/L  CBC with Differential     Status: Abnormal   Collection Time: 12/24/15  2:22 PM  Result Value Ref Range   WBC 13.1 (H) 4.0 - 10.5 K/uL   RBC 5.64 (H) 3.87 - 5.11 MIL/uL   Hemoglobin 16.4 (H) 12.0 - 15.0 g/dL   HCT 48.7 (H) 36.0 - 46.0 %   MCV 86.3 78.0 - 100.0 fL   MCH 29.1 26.0 - 34.0 pg   MCHC 33.7 30.0 - 36.0 g/dL   RDW 13.2 11.5 - 15.5 %   Platelets 361 150 - 400 K/uL   Neutrophils Relative % 73 %   Neutro Abs 9.6 (H) 1.7 - 7.7 K/uL   Lymphocytes Relative 20 %   Lymphs Abs 2.7 0.7 - 4.0 K/uL   Monocytes Relative 6 %  Monocytes Absolute 0.8 0.1 - 1.0 K/uL   Eosinophils Relative 1 %   Eosinophils Absolute 0.1 0.0 - 0.7 K/uL   Basophils Relative 0 %   Basophils Absolute 0.0 0.0 - 0.1 K/uL  Urinalysis, Routine w reflex microscopic     Status: Abnormal   Collection Time: 12/24/15  4:51 PM  Result Value Ref Range   Color, Urine ORANGE (A) YELLOW    Comment: BIOCHEMICALS MAY BE AFFECTED BY COLOR   APPearance CLEAR CLEAR   Specific Gravity, Urine 1.025 1.005 - 1.030   pH 6.5 5.0 - 8.0   Glucose, UA NEGATIVE NEGATIVE mg/dL   Hgb urine dipstick TRACE (A) NEGATIVE   Bilirubin Urine MODERATE (A) NEGATIVE   Ketones, ur >80 (A) NEGATIVE mg/dL   Protein, ur 30 (A) NEGATIVE mg/dL   Nitrite NEGATIVE NEGATIVE   Leukocytes, UA NEGATIVE NEGATIVE  Pregnancy, urine     Status: None   Collection Time: 12/24/15  4:51 PM  Result Value Ref Range   Preg Test, Ur NEGATIVE NEGATIVE    Comment:        THE SENSITIVITY OF THIS METHODOLOGY IS >20 mIU/mL.    Urine microscopic-add on     Status: Abnormal   Collection Time: 12/24/15  4:51 PM  Result Value Ref Range   Squamous Epithelial / LPF 0-5 (A) NONE SEEN   WBC, UA 0-5 0 - 5 WBC/hpf   RBC / HPF 0-5 0 - 5 RBC/hpf   Bacteria, UA RARE (A) NONE SEEN  APTT     Status: None   Collection Time: 12/24/15 10:38 PM  Result Value Ref Range   aPTT 30 24 - 37 seconds  Protime-INR     Status: None   Collection Time: 12/24/15 10:38 PM  Result Value Ref Range   Prothrombin Time 14.2 11.6 - 15.2 seconds   INR 1.08 0.00 - 1.49  Culture, blood (routine x 2)     Status: None (Preliminary result)   Collection Time: 12/24/15 10:38 PM  Result Value Ref Range   Specimen Description LEFT ANTECUBITAL    Special Requests BOTTLES DRAWN AEROBIC AND ANAEROBIC 4CC EACH    Culture PENDING    Report Status PENDING   Acetaminophen level     Status: Abnormal   Collection Time: 12/24/15 10:38 PM  Result Value Ref Range   Acetaminophen (Tylenol), Serum <10 (L) 10 - 30 ug/mL    Comment:        THERAPEUTIC CONCENTRATIONS VARY SIGNIFICANTLY. A RANGE OF 10-30 ug/mL MAY BE AN EFFECTIVE CONCENTRATION FOR MANY PATIENTS. HOWEVER, SOME ARE BEST TREATED AT CONCENTRATIONS OUTSIDE THIS RANGE. ACETAMINOPHEN CONCENTRATIONS >150 ug/mL AT 4 HOURS AFTER INGESTION AND >50 ug/mL AT 12 HOURS AFTER INGESTION ARE OFTEN ASSOCIATED WITH TOXIC REACTIONS.   Ethanol     Status: None   Collection Time: 12/24/15 10:38 PM  Result Value Ref Range   Alcohol, Ethyl (B) <5 <5 mg/dL    Comment:        LOWEST DETECTABLE LIMIT FOR SERUM ALCOHOL IS 5 mg/dL FOR MEDICAL PURPOSES ONLY   Lactate dehydrogenase     Status: None   Collection Time: 12/24/15 10:38 PM  Result Value Ref Range   LDH 174 98 - 192 U/L  Magnesium     Status: Abnormal   Collection Time: 12/24/15 10:38 PM  Result Value Ref Range   Magnesium 1.6 (L) 1.7 - 2.4 mg/dL  Phosphorus     Status: None   Collection Time: 12/24/15 10:38 PM  Result Value Ref Range    Phosphorus 3.4 2.5 - 4.6 mg/dL  TSH     Status: None   Collection Time: 12/24/15 10:38 PM  Result Value Ref Range   TSH 1.378 0.350 - 4.500 uIU/mL  Culture, blood (routine x 2)     Status: None (Preliminary result)   Collection Time: 12/24/15 10:49 PM  Result Value Ref Range   Specimen Description BLOOD LEFT HAND    Special Requests BOTTLES DRAWN AEROBIC AND ANAEROBIC 4CC EACH    Culture PENDING    Report Status PENDING   CBC     Status: Abnormal   Collection Time: 12/25/15  6:16 AM  Result Value Ref Range   WBC 11.2 (H) 4.0 - 10.5 K/uL   RBC 4.71 3.87 - 5.11 MIL/uL   Hemoglobin 13.6 12.0 - 15.0 g/dL   HCT 41.4 36.0 - 46.0 %   MCV 87.9 78.0 - 100.0 fL   MCH 28.9 26.0 - 34.0 pg   MCHC 32.9 30.0 - 36.0 g/dL   RDW 13.4 11.5 - 15.5 %   Platelets 274 150 - 400 K/uL  Comprehensive metabolic panel     Status: Abnormal   Collection Time: 12/25/15  6:16 AM  Result Value Ref Range   Sodium 141 135 - 145 mmol/L   Potassium 3.5 3.5 - 5.1 mmol/L   Chloride 109 101 - 111 mmol/L   CO2 24 22 - 32 mmol/L   Glucose, Bld 81 65 - 99 mg/dL   BUN 10 6 - 20 mg/dL   Creatinine, Ser 0.68 0.44 - 1.00 mg/dL   Calcium 8.3 (L) 8.9 - 10.3 mg/dL   Total Protein 6.6 6.5 - 8.1 g/dL   Albumin 3.8 3.5 - 5.0 g/dL   AST 90 (H) 15 - 41 U/L   ALT 214 (H) 14 - 54 U/L   Alkaline Phosphatase 60 38 - 126 U/L   Total Bilirubin 1.8 (H) 0.3 - 1.2 mg/dL   GFR calc non Af Amer >60 >60 mL/min   GFR calc Af Amer >60 >60 mL/min    Comment: (NOTE) The eGFR has been calculated using the CKD EPI equation. This calculation has not been validated in all clinical situations. eGFR's persistently <60 mL/min signify possible Chronic Kidney Disease.    Anion gap 8 5 - 15    US Abdomen Complete  12/24/2015  CLINICAL DATA:  Elevated LFTs with epigastric pain. EXAM: ABDOMEN ULTRASOUND COMPLETE COMPARISON:  None. FINDINGS: Gallbladder: Difficult to visualize secondary to poor acoustic through transmission in the liver  parenchyma and overlying bowel gas. Layering sludge noted. Possible tiny gallstones, but not definite. Gallbladder wall is upper normal for thickness. No pericholecystic fluid. The sonographer reports no sonographic Murphy sign. Common bile duct: Diameter: 3 mm Liver: Increased echogenicity with poor through transmission, likely related with fatty deposition. No focal abnormality identified. IVC: No abnormality visualized. Pancreas: Obscured by overlying bowel gas. Spleen: Size and appearance within normal limits. Right Kidney: Length: 11.8 cm. Echogenicity within normal limits. No mass or hydronephrosis visualized. Left Kidney: Length: 11.9 cm. Echogenicity within normal limits. No mass or hydronephrosis visualized. Abdominal aorta: Portions obscured by bowel gas. No aneurysm identified. Other findings: None. IMPRESSION: Technically difficult study secondary to body habitus. Gallbladder sludge with potential tiny gallstones. Gallbladder wall thickness is upper normal. No sonographic Murphy sign. No biliary dilatation. Electronically Signed   By: Misty Stanley M.D.   On: 12/24/2015 19:01    ROS: See chart Blood pressure 122/81, pulse 66,  temperature 98.3 F (36.8 C), temperature source Oral, resp. rate 20, height '5\' 7"'  (1.702 m), weight 116.529 kg (256 lb 14.4 oz), last menstrual period 12/15/2015, SpO2 100 %. Physical Exam: Anxious white female in no acute distress. Abdomen is soft with tenderness on the right upper quadrant to palpation. No hepatosplenomegaly, masses, or rigidity are noted.  Assessment/Plan: Impression: Cholecystitis, cholelithiasis. Liver enzyme tests are trending down. Plan: Continue IV pain control, IV hydration. Would continue IV Unasyn as ordered. Will reevaluate in a.m. Anticipate surgery in the next 48 hours. Will start clear liquid diet.  Diann Bangerter A 12/25/2015, 10:01 AM

## 2015-12-25 NOTE — Progress Notes (Signed)
Initial Nutrition Assessment  DOCUMENTATION CODES:  Morbid obesity, Non-severe (moderate) malnutrition in context of acute illness/injury   Pt meets criteria for SEVERE MALNUTRITION in the context of ACUTE ILLNESS  as evidenced by Loss of 1.4% bw in 1 week and an estimated energy intake that met < 75% of estimated needs for > 7 days.  INTERVENTION:  Pt NPO for likely surgical intervention, will order protein supplements once diet is advanced.  NUTRITION DIAGNOSIS:  Inadequate oral intake related to acute illness (acute cholecystitis), nausea, vomiting as evidenced by an estimated energy intake that met < 75% of estimated needs for > 7 days and a loss of 1-2% bw in 1 week  GOAL:  Patient will meet greater than or equal to 90% of their needs   MONITOR:  PO intake, Supplement acceptance, Diet advancement, Labs, I & O's  REASON FOR ASSESSMENT:  Malnutrition Screening Tool    ASSESSMENT:  30 y/o female PMHx Depression/anxiety presents w/ 2 weeks n/v and upper abdominal pain that worsens with meal intake. Reports fevers of up to 101. Workup shows acute cholecystitis. Admitted and anticipated cholecystectomy in next 1-2 days.   RD operating remotely. Pt w/ acute cholecystitis. As result has had n/v, weakness, and the reported in ability to keep any food down. She has lost 4 lbs (1.5% bw)  since she came to the ED for this issue 5 days ago. This is a clinically significant loss.   NFPE: Unable to assess  Labs reviewed: Leukocytosis, Elevated bili and liver enzymes.   Diet Order:  Diet NPO time specified  Skin:  Reviewed, no issues  Last BM:  3/10  Height:  Ht Readings from Last 1 Encounters:  12/24/15 _0  (1.702 m)   Weight:  Wt Readings from Last 1 Encounters:  12/24/15 256 lb 14.4 oz (116.529 kg)   Wt Readings from Last 10 Encounters:  12/24/15 256 lb 14.4 oz (116.529 kg)  12/19/15 260 lb (117.935 kg)  01/04/15 252 lb 6.4 oz (114.488 kg)  12/21/14 253 lb (114.76 kg)   12/16/14 249 lb 8 oz (113.172 kg)  12/19/12 230 lb (104.327 kg)   Ideal Body Weight:  61.36 kg  BMI:  Body mass index is 40.23 kg/(m^2).  Estimated Nutritional Needs:  Kcal:  1550-1750 (13-15 kcal/kg bw) Protein:  61-74 g Pro (1-1.2 g/kg IBW) Fluid:  1.6-1.8 liters fluid  EDUCATION NEEDS:  No education needs identified at this time  Burtis Junes RD, LDN Nutrition Pager: [phone] 12/25/2015 8:36 AM

## 2015-12-26 LAB — SURGICAL PCR SCREEN
MRSA, PCR: NEGATIVE
STAPHYLOCOCCUS AUREUS: NEGATIVE

## 2015-12-26 LAB — HEPATITIS PANEL, ACUTE
HEP A IGM: NEGATIVE
Hep B C IgM: NEGATIVE
Hepatitis B Surface Ag: NEGATIVE

## 2015-12-26 MED ORDER — CHLORHEXIDINE GLUCONATE 4 % EX LIQD
1.0000 "application " | Freq: Once | CUTANEOUS | Status: AC
  Administered 2015-12-26: 1 via TOPICAL
  Filled 2015-12-26: qty 15

## 2015-12-26 NOTE — Progress Notes (Signed)
  Subjective: Still with right upper quadrant abdominal pain.  Objective: Vital signs in last 24 hours: Temp:  [98 F (36.7 C)-98.4 F (36.9 C)] 98 F (36.7 C) (03/12 0647) Pulse Rate:  [58-68] 58 (03/12 0647) Resp:  [20] 20 (03/12 0647) BP: (134-152)/(69-81) 152/79 mmHg (03/12 0647) SpO2:  [100 %] 100 % (03/12 0647) Last BM Date: 12/24/15  Intake/Output from previous day: 03/11 0701 - 03/12 0700 In: 240 [P.O.:240] Out: 1750 [Urine:1750] Intake/Output this shift: Total I/O In: 120 [P.O.:120] Out: -   General appearance: alert, cooperative and fatigued GI: Soft with tenderness noted in the right upper quadrant to palpation. No rigidity noted.  Lab Results:   Recent Labs  12/24/15 1422 12/25/15 0616  WBC 13.1* 11.2*  HGB 16.4* 13.6  HCT 48.7* 41.4  PLT 361 274   BMET  Recent Labs  12/24/15 1422 12/25/15 0616  NA 141 141  K 3.9 3.5  CL 105 109  CO2 23 24  GLUCOSE 95 81  BUN 9 10  CREATININE 0.74 0.68  CALCIUM 9.7 8.3*   PT/INR  Recent Labs  12/24/15 2238  LABPROT 14.2  INR 1.08    Studies/Results: [place] Abdomen Complete  12/24/2015  CLINICAL DATA:  Elevated LFTs with epigastric pain. EXAM: ABDOMEN ULTRASOUND COMPLETE COMPARISON:  None. FINDINGS: Gallbladder: Difficult to visualize secondary to poor acoustic through transmission in the liver parenchyma and overlying bowel gas. Layering sludge noted. Possible tiny gallstones, but not definite. Gallbladder wall is upper normal for thickness. No pericholecystic fluid. The sonographer reports no sonographic Murphy sign. Common bile duct: Diameter: 3 mm Liver: Increased echogenicity with poor through transmission, likely related with fatty deposition. No focal abnormality identified. IVC: No abnormality visualized. Pancreas: Obscured by overlying bowel gas. Spleen: Size and appearance within normal limits. Right Kidney: Length: 11.8 cm. Echogenicity within normal limits. No mass or hydronephrosis visualized. Left  Kidney: Length: 11.9 cm. Echogenicity within normal limits. No mass or hydronephrosis visualized. Abdominal aorta: Portions obscured by bowel gas. No aneurysm identified. Other findings: None. IMPRESSION: Technically difficult study secondary to body habitus. Gallbladder sludge with potential tiny gallstones. Gallbladder wall thickness is upper normal. No sonographic Murphy sign. No biliary dilatation. Electronically Signed   By: Kennith CenterEric  Mansell M.D.   On: 12/24/2015 19:01    Anti-infectives: Anti-infectives    Start     Dose/Rate Route Frequency Ordered Stop   12/24/15 2145  Ampicillin-Sulbactam (UNASYN) 3 g in sodium chloride 0.9 % 100 mL IVPB     3 g 100 mL/hr over 60 Minutes Intravenous Every 6 hours 12/24/15 2130        Assessment/Plan: Impression: Cholecystitis, cholelithiasis Plan: We'll proceed with laparoscopic cholecystectomy with possible intraoperative cholangiogram tomorrow. The risks and benefits of the procedure including bleeding, infection, hepatobiliary injury, and the possibility of an open procedure were fully explained to the patient, who gave informed consent.  LOS: 2 days    Hannah Lambert A 12/26/2015

## 2015-12-26 NOTE — Progress Notes (Signed)
PROGRESS NOTE  Hannah FindersBrooke N Lambert WUJ:[id number] DOB: Mar 30, 1986 DOA: 12/24/2015 PCP: No PCP Per Patient  Assessment/Plan: Acute cholecystitis:  -Ultrasound somewhat limited due to body habitus but gallbladder sludge and potential small stones appreciated. Gallbladder wall thickness at upper level of normal. AST 157, ALT 308, total bilirubin 1.9, lipase 20, WBC 13.1, AF VSS. Positive Murphy sign on exam.  - appreciate: Dr. Lovell SheehanJenkins: Plan: Continue IV pain control, IV hydration. Would continue IV Unasyn as ordered.  Anticipate surgery in the next 48 hours. Will start clear liquid diet. - Unasyn, BCX - IVF - Clear liquid diet  Depression/Anxiety: no home medications. Well compensated. Excellent support at this time. Somewhat anxious - Low dose Ativan PRN  hypomagnesium -repleted  Code Status: full Family Communication: patient Disposition Plan: surgery   Consultants:  Surgery- Dr. Lovell SheehanJenkins  Procedures:      HPI/Subjective: Tolerating clears  Objective: Filed Vitals:   12/25/15 2041 12/26/15 0647  BP: 134/69 152/79  Pulse: 67 58  Temp: 98.4 F (36.9 C) 98 F (36.7 C)  Resp: 20 20    Intake/Output Summary (Last 24 hours) at 12/26/15 1208 Last data filed at 12/26/15 0800  Gross per 24 hour  Intake    240 ml  Output   1300 ml  Net  -1060 ml   Filed Weights   12/24/15 1345 12/24/15 2204  Weight: 116.257 kg (256 lb 4.8 oz) 116.529 kg (256 lb 14.4 oz)    Exam:   General:  awake  Cardiovascular: rrr  Respiratory: clear  Abdomen: +BS, min tenderness  Musculoskeletal: no edema   Data Reviewed: Basic Metabolic Panel:  Recent Labs Lab 12/24/15 1422 12/24/15 2238 12/25/15 0616  NA 141  --  141  K 3.9  --  3.5  CL 105  --  109  CO2 23  --  24  GLUCOSE 95  --  81  BUN 9  --  10  CREATININE 0.74  --  0.68  CALCIUM 9.7  --  8.3*  MG  --  1.6*  --   PHOS  --  3.4  --    Liver Function Tests:  Recent Labs Lab 12/24/15 1422 12/25/15 0616  AST  157* 90*  ALT 308* 214*  ALKPHOS 74 60  BILITOT 1.9* 1.8*  PROT 8.6* 6.6  ALBUMIN 4.8 3.8    Recent Labs Lab 12/24/15 1422  LIPASE 20   No results for input(s): AMMONIA in the last 168 hours. CBC:  Recent Labs Lab 12/24/15 1422 12/25/15 0616  WBC 13.1* 11.2*  NEUTROABS 9.6*  --   HGB 16.4* 13.6  HCT 48.7* 41.4  MCV 86.3 87.9  PLT 361 274   Cardiac Enzymes: No results for input(s): CKTOTAL, CKMB, CKMBINDEX, TROPONINI in the last 168 hours. BNP (last 3 results) No results for input(s): BNP in the last 8760 hours.  ProBNP (last 3 results) No results for input(s): PROBNP in the last 8760 hours.  CBG: No results for input(s): GLUCAP in the last 168 hours.  Recent Results (from the past 240 hour(s))  Culture, blood (routine x 2)     Status: None (Preliminary result)   Collection Time: 12/24/15 10:38 PM  Result Value Ref Range Status   Specimen Description LEFT ANTECUBITAL  Final   Special Requests BOTTLES DRAWN AEROBIC AND ANAEROBIC 4CC EACH  Final   Culture NO GROWTH 2 DAYS  Final   Report Status PENDING  Incomplete  Culture, blood (routine x 2)     Status: None (  Preliminary result)   Collection Time: 12/24/15 10:49 PM  Result Value Ref Range Status   Specimen Description BLOOD LEFT HAND  Final   Special Requests BOTTLES DRAWN AEROBIC AND ANAEROBIC 4CC EACH  Final   Culture NO GROWTH 2 DAYS  Final   Report Status PENDING  Incomplete     Studies: US Abdomen Complete  12/24/2015  CLINICAL DATA:  Elevated LFTs with epigastric pain. EXAM: ABDOMEN ULTRASOUND COMPLETE COMPARISON:  None. FINDINGS: Gallbladder: Difficult to visualize secondary to poor acoustic through transmission in the liver parenchyma and overlying bowel gas. Layering sludge noted. Possible tiny gallstones, but not definite. Gallbladder wall is upper normal for thickness. No pericholecystic fluid. The sonographer reports no sonographic Murphy sign. Common bile duct: Diameter: 3 mm Liver: Increased  echogenicity with poor through transmission, likely related with fatty deposition. No focal abnormality identified. IVC: No abnormality visualized. Pancreas: Obscured by overlying bowel gas. Spleen: Size and appearance within normal limits. Right Kidney: Length: 11.8 cm. Echogenicity within normal limits. No mass or hydronephrosis visualized. Left Kidney: Length: 11.9 cm. Echogenicity within normal limits. No mass or hydronephrosis visualized. Abdominal aorta: Portions obscured by bowel gas. No aneurysm identified. Other findings: None. IMPRESSION: Technically difficult study secondary to body habitus. Gallbladder sludge with potential tiny gallstones. Gallbladder wall thickness is upper normal. No sonographic Murphy sign. No biliary dilatation. Electronically Signed   By: Kennith Center M.D.   On: 12/24/2015 19:01    Scheduled Meds: . ampicillin-sulbactam (UNASYN) IV  3 g Intravenous Q6H  . enoxaparin (LOVENOX) injection  40 mg Subcutaneous Q24H   Continuous Infusions: . dextrose 5 % and 0.45 % NaCl with KCl 20 mEq/L 50 mL/hr at 12/26/15 0921   Antibiotics Given (last 72 hours)    Date/Time Action Medication Dose Rate   12/25/15 0300 Given   Ampicillin-Sulbactam (UNASYN) 3 g in sodium chloride 0.9 % 100 mL IVPB 3 g 100 mL/hr   12/25/15 0941 Given   Ampicillin-Sulbactam (UNASYN) 3 g in sodium chloride 0.9 % 100 mL IVPB 3 g 100 mL/hr   12/25/15 1519 Given   Ampicillin-Sulbactam (UNASYN) 3 g in sodium chloride 0.9 % 100 mL IVPB 3 g 100 mL/hr   12/25/15 2040 Given   Ampicillin-Sulbactam (UNASYN) 3 g in sodium chloride 0.9 % 100 mL IVPB 3 g 100 mL/hr   12/26/15 0342 Given   Ampicillin-Sulbactam (UNASYN) 3 g in sodium chloride 0.9 % 100 mL IVPB 3 g 100 mL/hr   12/26/15 1478 Given   Ampicillin-Sulbactam (UNASYN) 3 g in sodium chloride 0.9 % 100 mL IVPB 3 g 100 mL/hr      Active Problems:   Acute cholecystitis   Leukocytosis   Transaminitis   Depression with anxiety   Gallstones    Cholecystitis   Malnutrition of moderate degree    Time spent: 25 min    Norah Fick U [facility]  [facility] Pager [phone]. If 7PM-7AM, please contact night-coverage at [email or web address], password [facility] 12/26/2015, 12:08 PM  LOS: 2 days

## 2015-12-27 ENCOUNTER — Encounter (HOSPITAL_COMMUNITY)
Admission: EM | Disposition: A | Discharge status: Home / Self Care | Payer: Self-pay | Source: Home / Self Care | Attending: Internal Medicine

## 2015-12-27 ENCOUNTER — Inpatient Hospital Stay (HOSPITAL_COMMUNITY): Payer: MEDICAID

## 2015-12-27 DIAGNOSIS — K810 Acute cholecystitis: Secondary | ICD-10-CM

## 2015-12-27 DIAGNOSIS — K8010 Calculus of gallbladder with chronic cholecystitis without obstruction: Secondary | ICD-10-CM

## 2015-12-27 DIAGNOSIS — R1010 Upper abdominal pain, unspecified: Secondary | ICD-10-CM

## 2015-12-27 DIAGNOSIS — R7989 Other specified abnormal findings of blood chemistry: Secondary | ICD-10-CM

## 2015-12-27 LAB — CBC
HCT: 43.1 % (ref 36.0–46.0)
Hemoglobin: 14.1 g/dL (ref 12.0–15.0)
MCH: 29.1 pg (ref 26.0–34.0)
MCHC: 32.7 g/dL (ref 30.0–36.0)
MCV: 88.9 fL (ref 78.0–100.0)
PLATELETS: 256 10*3/uL (ref 150–400)
RBC: 4.85 MIL/uL (ref 3.87–5.11)
RDW: 13.4 % (ref 11.5–15.5)
WBC: 9.2 10*3/uL (ref 4.0–10.5)

## 2015-12-27 LAB — HEMOGLOBIN A1C
Hgb A1c MFr Bld: 5.4 % (ref 4.8–5.6)
Mean Plasma Glucose: 108 mg/dL

## 2015-12-27 LAB — BASIC METABOLIC PANEL
Anion gap: 9 (ref 5–15)
CO2: 23 mmol/L (ref 22–32)
CREATININE: 0.64 mg/dL (ref 0.44–1.00)
Calcium: 8.7 mg/dL — ABNORMAL LOW (ref 8.9–10.3)
Chloride: 105 mmol/L (ref 101–111)
GFR calc Af Amer: >60 mL/min (ref >60)
GLUCOSE: 83 mg/dL (ref 65–99)
POTASSIUM: 4.2 mmol/L (ref 3.5–5.1)
Sodium: 137 mmol/L (ref 135–145)

## 2015-12-27 LAB — HEPATIC FUNCTION PANEL
ALT: 293 U/L — AB (ref 14–54)
AST: 137 U/L — ABNORMAL HIGH (ref 15–41)
Albumin: 3.8 g/dL (ref 3.5–5.0)
Alkaline Phosphatase: 66 U/L (ref 38–126)
BILIRUBIN DIRECT: 1.4 mg/dL — AB (ref 0.1–0.5)
BILIRUBIN INDIRECT: 1.3 mg/dL — AB (ref 0.3–0.9)
Total Bilirubin: 2.7 mg/dL — ABNORMAL HIGH (ref 0.3–1.2)
Total Protein: 6.5 g/dL (ref 6.5–8.1)

## 2015-12-27 SURGERY — LAPAROSCOPIC CHOLECYSTECTOMY WITH INTRAOPERATIVE CHOLANGIOGRAM
Anesthesia: General

## 2015-12-27 NOTE — Progress Notes (Signed)
Liver tests are not normalizing likely should be, thus her laparoscopic cholecystectomy has been canceled. Need to rule out choledocholithiasis prior to surgery. Dr. Karilyn Cotaehman has been consulted. Will get MRCP. Patient and family have been notified.

## 2015-12-27 NOTE — Progress Notes (Signed)
Referring Provider: Franky Macho, MD Primary Care Physician:  No PCP Per Patient Primary Gastroenterologist:  Dr. Karilyn Cota  Reason for Consultation:    Elevated transaminases in a patient with cholelithiasis and cholecystitis.  HPI:   Patient is 30 year old Caucasian female whose present illness started about 2 weeks ago with headache followed by nausea and vomiting. Patient thought she had viral illness but her symptoms would not go away. Patient came to emergency room 3 days ago. She was noted to have elevated transaminases. She began to have epigastric and right upper quadrant pain while she was waiting in emergency room. Ultrasound revealed sludge small stones and gallbladder wall in upper limit of normal. Patient was felt to have acute cholecystitis and hospitalized. She was begun on Unasyn. She was seen in consultation by Dr. Lovell Sheehan. Patient was supposed to have laparoscopic cholecystectomy today. However her bilirubin and transaminases are higher than they had been yesterday. Dr. Lovell Sheehan was was therefore concerned that she may have choledocholithiasis and surgery was postponed. MRCP was not possible because of anxiety and body habitus. Patient feels better. She complains of mild pain in right upper quadrant. She feels pain medications helping her. She has noted orange color to her urine over the last 2 days. She denies hematemesis melena or rectal bleeding. She does complain of intermittent dizziness and lightheaded with her bowel movements. She has been evaluated at [facility] and told her symptoms were due to stress and anxiety. Patient smokes cigarettes for about 10 years anywhere from 4-5 cigarettes per day but quit one year ago. She drinks alcohol socially but not daily. Me history significant for gallbladder disease in both parents. Both her mother and father have had cholecystectomy. She has 1 brother was spina bifida but no gallbladder disease.   Past Medical History   Diagnosis Date  . Depression   . Anxiety     History reviewed. No pertinent past surgical history.  Prior to Admission medications   Medication Sig Start Date End Date Taking? Authorizing Provider  promethazine (PHENERGAN) 25 MG tablet Take 1 tablet (25 mg total) by mouth every 6 (six) hours as needed for nausea or vomiting. 12/19/15  Yes Bethann Berkshire, MD  traMADol (ULTRAM) 50 MG tablet Take 1 tablet (50 mg total) by mouth every 6 (six) hours as needed. 12/19/15  Yes Bethann Berkshire, MD  nitrofurantoin, macrocrystal-monohydrate, (MACROBID) 100 MG capsule Take 1 capsule (100 mg total) by mouth 2 (two) times daily. Patient not taking: Reported on 12/19/2015 01/04/15   Raelyn Ensign, PA  ondansetron (ZOFRAN ODT) 4 MG disintegrating tablet Take 1 tablet (4 mg total) by mouth every 8 (eight) hours as needed for nausea or vomiting. Patient not taking: Reported on 12/19/2015 12/21/14   Pearline Cables, MD    Current Facility-Administered Medications  Medication Dose Route Frequency Provider Last Rate Last Dose  . acetaminophen (TYLENOL) tablet 650 mg  650 mg Oral Q6H PRN Ozella Rocks, MD       Or  . acetaminophen (TYLENOL) suppository 650 mg  650 mg Rectal Q6H PRN Ozella Rocks, MD      . Ampicillin-Sulbactam (UNASYN) 3 g in sodium chloride 0.9 % 100 mL IVPB  3 g Intravenous Q6H Ozella Rocks, MD   3 g at 12/27/15 0853  . dextrose 5 % and 0.45 % NaCl with KCl 20 mEq/L infusion   Intravenous Continuous Joseph Art, DO 50 mL/hr at 12/27/15 1208    . enoxaparin (LOVENOX) injection 40 mg  40 mg Subcutaneous Q24H Franky MachoMark Jenkins, MD   40 mg at 12/25/15 1100  . LORazepam (ATIVAN) injection 1 mg  1 mg Intravenous Q4H PRN Franky MachoMark Jenkins, MD      . morphine 4 MG/ML injection 4 mg  4 mg Intravenous Q2H PRN Ozella Rocksavid J Merrell, MD   4 mg at 12/27/15 0859  . ondansetron (ZOFRAN) tablet 4 mg  4 mg Oral Q6H PRN Ozella Rocksavid J Merrell, MD       Or  . ondansetron [facility](ZOFRAN) injection 4 mg  4 mg Intravenous Q6H PRN Ozella Rocksavid J  Merrell, MD   4 mg at 12/27/15 [id number]  . oxyCODONE (Oxy IR/ROXICODONE) immediate release tablet 5 mg  5 mg Oral Q4H PRN Ozella Rocksavid J Merrell, MD   5 mg at 12/27/15 1220    Allergies as of 12/24/2015  . (No Known Allergies)    Family History  Problem Relation Age of Onset  . Hypertension Mother   . Hypertension Father   . Hypertension Maternal Grandmother   . Mental illness Maternal Grandmother   . Mental illness Paternal Grandmother     Social History   Social History  . Marital Status: Single    Spouse Name: N/A  . Number of Children: N/A  . Years of Education: N/A   Occupational History  . Not on file.   Social History Main Topics  . Smoking status: Former Smoker    Types: Cigarettes    Quit date: 07/17/2015  . Smokeless tobacco: Never Used     Comment: 1/4 ppd  . Alcohol Use: 0.0 oz/week    0 Standard drinks or equivalent per week     Comment: occasional  . Drug Use: No  . Sexual Activity: Yes    Birth Control/ Protection: None   Other Topics Concern  . Not on file   Social History Narrative    Review of Systems: See HPI, otherwise normal ROS  Physical Exam: Temp:  [97.5 F (36.4 C)-97.8 F (36.6 C)] 97.5 F (36.4 C) (03/13 0642) Pulse Rate:  [57-77] 77 (03/13 0642) Resp:  [20] 20 (03/13 0642) BP: (123-136)/(67-90) 132/67 mmHg (03/13 0642) SpO2:  [97 %-98 %] 97 % (03/13 0642) Last BM Date: 12/24/15 Patient is alert and in no acute distress. Conjunctiva was pink. Sclerae nonicteric. She has facial acne. Oropharyngeal mucosa is normal. No neck masses or thyromegaly noted. Cardiac exam with regular rhythm normal S1 and S2. No murmur or gallop noted. Lungs are clear to auscultation. Abdomen is these. Bowel sounds are normal. On palpation abdomen is soft with mild tenderness at LLQ and right upper quadrant. No guarding organomegaly or masses noted. No peripheral edema or clubbing noted.  Lab Results:  Recent Labs  12/24/15 1422 12/25/15 0616  12/27/15 0736  WBC 13.1* 11.2* 9.2  HGB 16.4* 13.6 14.1  HCT 48.7* 41.4 43.1  PLT 361 274 256   BMET  Recent Labs  12/24/15 1422 12/25/15 0616 12/27/15 0729  NA 141 141 137  K 3.9 3.5 4.2  CL 105 109 105  CO2 23 24 23   GLUCOSE 95 81 83  BUN 9 10 <5*  CREATININE 0.74 0.68 0.64  CALCIUM 9.7 8.3* 8.7*   LFT  Recent Labs  12/27/15 0729  PROT 6.5  ALBUMIN 3.8  AST 137*  ALT 293*  ALKPHOS 66  BILITOT 2.7*  BILIDIR 1.4*  IBILI 1.3*   PT/INR  Recent Labs  12/24/15 2238  LABPROT 14.2  INR 1.08   Hepatitis Panel  Recent  Labs  12/24/15 2238  HEPBSAG Negative  HCVAB <0.1  HEPAIGM Negative  HEPBIGM Negative    Ultrasound from 12/23/2025 reviewed. Bile duct measured 3 mm. She has sludge and small gallstones. Liver is echogenic consistent with fatty liver.  Assessment;  Patient is 30 year old Caucasian female who presents with 2 week history of nausea and vomiting with acute onset of right upper quadrant abdominal pain and evidence of cholelithiasis on ultrasound. She had mildly elevated transaminases 8 days ago with AST of 63 and ALT of 74. Bilirubin and transaminases are trending upwards most likely due to small stone not recognized on ultrasound. She must have a small stone which usually past spontaneously. If bilirubin and transaminases  trend downwards we could proceed with laparoscopic cholecystectomy along with intraoperative cholangiogram if possible. If this is not the case she may need repeat ultrasound or MRCP prior to considering therapeutic ERCP. While mildly elevated transaminases a days ago could be secondary to fatty liver. Transaminases were normal one year ago.  Recommendations;  LFTs in a.m. Further recommendations to follow.   LOS: 3 days   Loren Sawaya U  12/27/2015, 1:08 PM

## 2015-12-27 NOTE — Progress Notes (Addendum)
PROGRESS NOTE  Hannah FindersBrooke N Lambert ZOX:[id number] DOB: 1985-11-07 DOA: 12/24/2015 PCP: No PCP Per Patient  Assessment/Plan: Acute cholecystitis:  -Ultrasound somewhat limited due to body habitus but gallbladder sludge and potential small stones appreciated. Gallbladder wall thickness at upper level of normal. AST 157, ALT 308, total bilirubin 1.9, lipase 20, WBC 13.1, AF VSS. Positive Murphy sign on exam.  - appreciate: Dr. Lovell SheehanJenkins: Plan was for  laparoscopic cholecystectomy with possible intraoperative cholangiogram 3/13 but LFTs trending up so will do MRCP and possible ERCP first - Unasyn, BCX - IVF  Elevated LFTs -hepatitis panel negative -see above  Depression/Anxiety: no home medications. Well compensated. Excellent support at this time. Somewhat anxious - Low dose Ativan PRN  hypomagnesium -repleted  Code Status: full Family Communication: patient Disposition Plan: pending results of MRCP   Consultants:  Surgery- Dr. Lovell SheehanJenkins  Procedures:      HPI/Subjective: Anxious about procedure  Objective: Filed Vitals:   12/26/15 2014 12/27/15 0642  BP: 123/68 132/67  Pulse: 59 77  Temp: 97.8 F (36.6 C) 97.5 F (36.4 C)  Resp: 20 20    Intake/Output Summary (Last 24 hours) at 12/27/15 0816 Last data filed at 12/27/15 0000  Gross per 24 hour  Intake 3057.5 ml  Output   3600 ml  Net -542.5 ml   Filed Weights   12/24/15 1345 12/24/15 2204  Weight: 116.257 kg (256 lb 4.8 oz) 116.529 kg (256 lb 14.4 oz)    Exam:   General:  awake  Cardiovascular: rrr  Respiratory: clear  Abdomen: +BS, + tenderness  Musculoskeletal: no edema   Data Reviewed: Basic Metabolic Panel:  Recent Labs Lab 12/24/15 1422 12/24/15 2238 12/25/15 0616  NA 141  --  141  K 3.9  --  3.5  CL 105  --  109  CO2 23  --  24  GLUCOSE 95  --  81  BUN 9  --  10  CREATININE 0.74  --  0.68  CALCIUM 9.7  --  8.3*  MG  --  1.6*  --   PHOS  --  3.4  --    Liver Function  Tests:  Recent Labs Lab 12/24/15 1422 12/25/15 0616  AST 157* 90*  ALT 308* 214*  ALKPHOS 74 60  BILITOT 1.9* 1.8*  PROT 8.6* 6.6  ALBUMIN 4.8 3.8    Recent Labs Lab 12/24/15 1422  LIPASE 20   No results for input(s): AMMONIA in the last 168 hours. CBC:  Recent Labs Lab 12/24/15 1422 12/25/15 0616  WBC 13.1* 11.2*  NEUTROABS 9.6*  --   HGB 16.4* 13.6  HCT 48.7* 41.4  MCV 86.3 87.9  PLT 361 274   Cardiac Enzymes: No results for input(s): CKTOTAL, CKMB, CKMBINDEX, TROPONINI in the last 168 hours. BNP (last 3 results) No results for input(s): BNP in the last 8760 hours.  ProBNP (last 3 results) No results for input(s): PROBNP in the last 8760 hours.  CBG: No results for input(s): GLUCAP in the last 168 hours.  Recent Results (from the past 240 hour(s))  Culture, blood (routine x 2)     Status: None (Preliminary result)   Collection Time: 12/24/15 10:38 PM  Result Value Ref Range Status   Specimen Description LEFT ANTECUBITAL  Final   Special Requests BOTTLES DRAWN AEROBIC AND ANAEROBIC 4CC EACH  Final   Culture NO GROWTH 2 DAYS  Final   Report Status PENDING  Incomplete  Culture, blood (routine x 2)     Status:  None (Preliminary result)   Collection Time: 12/24/15 10:49 PM  Result Value Ref Range Status   Specimen Description BLOOD LEFT HAND  Final   Special Requests BOTTLES DRAWN AEROBIC AND ANAEROBIC 4CC EACH  Final   Culture NO GROWTH 2 DAYS  Final   Report Status PENDING  Incomplete  Surgical pcr screen     Status: None   Collection Time: 12/26/15  4:41 PM  Result Value Ref Range Status   MRSA, PCR NEGATIVE NEGATIVE Final   Staphylococcus aureus NEGATIVE NEGATIVE Final    Comment:        The Xpert SA Assay (FDA approved for NASAL specimens in patients over 61 years of age), is one component of a comprehensive surveillance program.  Test performance has been validated by [facility] for patients greater than or equal to 70 year old. It is not  intended to diagnose infection nor to guide or monitor treatment.      Studies: No results found.  Scheduled Meds: . ampicillin-sulbactam (UNASYN) IV  3 g Intravenous Q6H  . enoxaparin (LOVENOX) injection  40 mg Subcutaneous Q24H   Continuous Infusions: . dextrose 5 % and 0.45 % NaCl with KCl 20 mEq/L 50 mL/hr at 12/27/15 0056   Antibiotics Given (last 72 hours)    Date/Time Action Medication Dose Rate   12/25/15 0300 Given   Ampicillin-Sulbactam (UNASYN) 3 g in sodium chloride 0.9 % 100 mL IVPB 3 g 100 mL/hr   12/25/15 0941 Given   Ampicillin-Sulbactam (UNASYN) 3 g in sodium chloride 0.9 % 100 mL IVPB 3 g 100 mL/hr   12/25/15 1519 Given   Ampicillin-Sulbactam (UNASYN) 3 g in sodium chloride 0.9 % 100 mL IVPB 3 g 100 mL/hr   12/25/15 2040 Given   Ampicillin-Sulbactam (UNASYN) 3 g in sodium chloride 0.9 % 100 mL IVPB 3 g 100 mL/hr   12/26/15 0342 Given   Ampicillin-Sulbactam (UNASYN) 3 g in sodium chloride 0.9 % 100 mL IVPB 3 g 100 mL/hr   12/26/15 1610 Given   Ampicillin-Sulbactam (UNASYN) 3 g in sodium chloride 0.9 % 100 mL IVPB 3 g 100 mL/hr   12/26/15 1517 Given   Ampicillin-Sulbactam (UNASYN) 3 g in sodium chloride 0.9 % 100 mL IVPB 3 g 100 mL/hr   12/26/15 2158 Given   Ampicillin-Sulbactam (UNASYN) 3 g in sodium chloride 0.9 % 100 mL IVPB 3 g 100 mL/hr   12/27/15 9604 Given   Ampicillin-Sulbactam (UNASYN) 3 g in sodium chloride 0.9 % 100 mL IVPB 3 g 100 mL/hr      Active Problems:   Acute cholecystitis   Leukocytosis   Transaminitis   Depression with anxiety   Gallstones   Cholecystitis   Malnutrition of moderate degree    Time spent: 25 min    Demetria Iwai U [facility]  [facility] Pager [phone]. If 7PM-7AM, please contact night-coverage at [email or web address], password [facility] 12/27/2015, 8:16 AM  LOS: 3 days

## 2015-12-28 ENCOUNTER — Inpatient Hospital Stay (HOSPITAL_COMMUNITY): Payer: Self-pay

## 2015-12-28 ENCOUNTER — Inpatient Hospital Stay (HOSPITAL_COMMUNITY): Payer: MEDICAID

## 2015-12-28 LAB — COMPREHENSIVE METABOLIC PANEL
ALBUMIN: 3.8 g/dL (ref 3.5–5.0)
ALT: 330 U/L — AB (ref 14–54)
AST: 167 U/L — AB (ref 15–41)
Alkaline Phosphatase: 65 U/L (ref 38–126)
Anion gap: 11 (ref 5–15)
BUN: 5 mg/dL — AB (ref 6–20)
CHLORIDE: 102 mmol/L (ref 101–111)
CO2: 26 mmol/L (ref 22–32)
CREATININE: 0.65 mg/dL (ref 0.44–1.00)
Calcium: 8.7 mg/dL — ABNORMAL LOW (ref 8.9–10.3)
GFR calc Af Amer: >60 mL/min (ref >60)
GLUCOSE: 81 mg/dL (ref 65–99)
Potassium: 3.9 mmol/L (ref 3.5–5.1)
Sodium: 139 mmol/L (ref 135–145)
Total Bilirubin: 3.2 mg/dL — ABNORMAL HIGH (ref 0.3–1.2)
Total Protein: 6.9 g/dL (ref 6.5–8.1)

## 2015-12-28 LAB — CBC
HCT: 42.5 % (ref 36.0–46.0)
Hemoglobin: 13.8 g/dL (ref 12.0–15.0)
MCH: 29 pg (ref 26.0–34.0)
MCHC: 32.5 g/dL (ref 30.0–36.0)
MCV: 89.3 fL (ref 78.0–100.0)
PLATELETS: 264 10*3/uL (ref 150–400)
RBC: 4.76 MIL/uL (ref 3.87–5.11)
RDW: 13.4 % (ref 11.5–15.5)
WBC: 10 10*3/uL (ref 4.0–10.5)

## 2015-12-28 MED ORDER — LORAZEPAM 2 MG/ML IJ SOLN
1.0000 mg | Freq: Once | INTRAMUSCULAR | Status: AC
  Administered 2015-12-28: 1 mg via INTRAVENOUS
  Filled 2015-12-28: qty 1

## 2015-12-28 MED ORDER — HYOSCYAMINE SULFATE 0.125 MG SL SUBL: 0.1250 mg | SUBLINGUAL_TABLET | Freq: Four times a day (QID) | SUBLINGUAL | Status: DC | PRN

## 2015-12-28 MED ORDER — PROCHLORPERAZINE EDISYLATE 5 MG/ML IJ SOLN: 10.0000 mg | Freq: Four times a day (QID) | INTRAMUSCULAR | Status: DC | PRN

## 2015-12-28 MED ORDER — CHLORHEXIDINE GLUCONATE 4 % EX LIQD
Freq: Once | CUTANEOUS | Status: DC
  Filled 2015-12-28: qty 15

## 2015-12-28 MED ORDER — CHLORHEXIDINE GLUCONATE 4 % EX LIQD
1.0000 "application " | Freq: Once | CUTANEOUS | Status: AC
  Administered 2015-12-29: 1 via TOPICAL
  Filled 2015-12-28: qty 15

## 2015-12-28 NOTE — Progress Notes (Addendum)
PROGRESS NOTE  Hannah Lambert ZOX:[id number] DOB: [birth year] DOA: 12/24/2015 PCP: No PCP Per Patient  30 yo female who was felt to have acute cholecystitis and hospitalized. She was begun on Unasyn. She was seen in consultation by Dr. Lovell Sheehan. Patient was supposed to have laparoscopic cholecystectomy Monday but her bilirubin and transaminases are trending up.  There was concern that she may have choledocholithiasis and surgery was postponed. MRCP was not possible because of anxiety and body habitus.  GI consulted.     Assessment/Plan: Acute cholecystitis:  -Ultrasound somewhat limited due to body habitus but gallbladder sludge and potential small stones appreciated. Gallbladder wall thickness at upper level of normal. AST 157, ALT 308, total bilirubin 1.9, lipase 20, WBC 13.1, AF VSS. Positive Murphy sign on exam.  - appreciate: Dr. Lovell Sheehan: Plan was for  laparoscopic cholecystectomy with possible intraoperative cholangiogram 3/13 but LFTs trending up so will do MRCP and possible ERCP first --- patient was unable to complete MRCP As she said her IV "blew" -appreciate GI consult - Unasyn, BCX - IVF  Elevated LFTs-trending up -hepatitis panel negative -see above  Depression/Anxiety: no home medications - Low dose Ativan PRN  hypomagnesium -repleted  Code Status: full Family Communication: patient Disposition Plan: plan per surgery/GI   Consultants:  Surgery- Dr. Lovell Sheehan  GI  Procedures:      HPI/Subjective: Tearful this AM Says she will need something for anxiety before she can do MRI of abd  Objective: Filed Vitals:   12/27/15 2206 12/28/15 0600  BP: 129/85 125/74  Pulse: 68 68  Temp: 98.3 F (36.8 C) 98.2 F (36.8 C)  Resp: 20 20    Intake/Output Summary (Last 24 hours) at 12/28/15 0807 Last data filed at 12/28/15 8119  Gross per 24 hour  Intake 2697.5 ml  Output    600 ml  Net 2097.5 ml   Filed Weights   12/24/15 1345 12/24/15 2204  Weight:  116.257 kg (256 lb 4.8 oz) 116.529 kg (256 lb 14.4 oz)    Exam:   General:  awake  Cardiovascular: rrr  Respiratory: clear  Abdomen: +BS, + tenderness  Musculoskeletal: no edema   Data Reviewed: Basic Metabolic Panel:  Recent Labs Lab 12/24/15 1422 12/24/15 2238 12/25/15 0616 12/27/15 0729 12/28/15 0456  NA 141  --  141 137 139  K 3.9  --  3.5 4.2 3.9  CL 105  --  109 105 102  CO2 23  --  GLUCOSE 95  --  81 83 81  BUN 9  --  10 <5* 5*  CREATININE 0.74  --  0.68 0.64 0.65  CALCIUM 9.7  --  8.3* 8.7* 8.7*  MG  --  1.6*  --   --   --   PHOS  --  3.4  --   --   --    Liver Function Tests:  Recent Labs Lab 12/24/15 1422 12/25/15 0616 12/27/15 0729 12/28/15 0456  AST 157* 90* 137* 167*  ALT 308* 214* 293* 330*  ALKPHOS 74 60 66 65  BILITOT 1.9* 1.8* 2.7* 3.2*  PROT 8.6* 6.6 6.5 6.9  ALBUMIN 4.8 3.8 3.8 3.8    Recent Labs Lab 12/24/15 1422  LIPASE 20   No results for input(s): AMMONIA in the last 168 hours. CBC:  Recent Labs Lab 12/24/15 1422 12/25/15 0616 12/27/15 0736 12/28/15 0456  WBC 13.1* 11.2* 9.2 10.0  NEUTROABS 9.6*  --   --   --   HGB  16.4* 13.6 14.1 13.8  HCT 48.7* 41.4 43.1 42.5  MCV 86.3 87.9 88.9 89.3  PLT 361 274 256 264   Cardiac Enzymes: No results for input(s): CKTOTAL, CKMB, CKMBINDEX, TROPONINI in the last 168 hours. BNP (last 3 results) No results for input(s): BNP in the last 8760 hours.  ProBNP (last 3 results) No results for input(s): PROBNP in the last 8760 hours.  CBG: No results for input(s): GLUCAP in the last 168 hours.  Recent Results (from the past 240 hour(s))  Culture, blood (routine x 2)     Status: None (Preliminary result)   Collection Time: 12/24/15 10:38 PM  Result Value Ref Range Status   Specimen Description LEFT ANTECUBITAL  Final   Special Requests BOTTLES DRAWN AEROBIC AND ANAEROBIC 4CC EACH  Final   Culture NO GROWTH 3 DAYS  Final   Report Status PENDING  Incomplete  Culture,  blood (routine x 2)     Status: None (Preliminary result)   Collection Time: 12/24/15 10:49 PM  Result Value Ref Range Status   Specimen Description BLOOD LEFT HAND  Final   Special Requests BOTTLES DRAWN AEROBIC AND ANAEROBIC 4CC EACH  Final   Culture NO GROWTH 3 DAYS  Final   Report Status PENDING  Incomplete  Surgical pcr screen     Status: None   Collection Time: 12/26/15  4:41 PM  Result Value Ref Range Status   MRSA, PCR NEGATIVE NEGATIVE Final   Staphylococcus aureus NEGATIVE NEGATIVE Final    Comment:        The Xpert SA Assay (FDA approved for NASAL specimens in patients over 28 years of age), is one component of a comprehensive surveillance program.  Test performance has been validated by [facility] for patients greater than or equal to 28 year old. It is not intended to diagnose infection nor to guide or monitor treatment.      Studies: No results found.  Scheduled Meds: . ampicillin-sulbactam (UNASYN) IV  3 g Intravenous Q6H  . enoxaparin (LOVENOX) injection  40 mg Subcutaneous Q24H   Continuous Infusions: . dextrose 5 % and 0.45 % NaCl with KCl 20 mEq/L 50 mL/hr at 12/28/15 0246   Antibiotics Given (last 72 hours)    Date/Time Action Medication Dose Rate   12/25/15 0941 Given   Ampicillin-Sulbactam (UNASYN) 3 g in sodium chloride 0.9 % 100 mL IVPB 3 g 100 mL/hr   12/25/15 1519 Given   Ampicillin-Sulbactam (UNASYN) 3 g in sodium chloride 0.9 % 100 mL IVPB 3 g 100 mL/hr   12/25/15 2040 Given   Ampicillin-Sulbactam (UNASYN) 3 g in sodium chloride 0.9 % 100 mL IVPB 3 g 100 mL/hr   12/26/15 0342 Given   Ampicillin-Sulbactam (UNASYN) 3 g in sodium chloride 0.9 % 100 mL IVPB 3 g 100 mL/hr   12/26/15 [id number] Given   Ampicillin-Sulbactam (UNASYN) 3 g in sodium chloride 0.9 % 100 mL IVPB 3 g 100 mL/hr   12/26/15 1517 Given   Ampicillin-Sulbactam (UNASYN) 3 g in sodium chloride 0.9 % 100 mL IVPB 3 g 100 mL/hr   12/26/15 2158 Given   Ampicillin-Sulbactam (UNASYN) 3  g in sodium chloride 0.9 % 100 mL IVPB 3 g 100 mL/hr   12/27/15 0311 Given   Ampicillin-Sulbactam (UNASYN) 3 g in sodium chloride 0.9 % 100 mL IVPB 3 g 100 mL/hr   12/27/15 0853 Given   Ampicillin-Sulbactam (UNASYN) 3 g in sodium chloride 0.9 % 100 mL IVPB 3 g 100 mL/hr  12/27/15 1602 Given   Ampicillin-Sulbactam (UNASYN) 3 g in sodium chloride 0.9 % 100 mL IVPB 3 g 100 mL/hr   12/27/15 2055 Given   Ampicillin-Sulbactam (UNASYN) 3 g in sodium chloride 0.9 % 100 mL IVPB 3 g 100 mL/hr   12/28/15 0241 Given   Ampicillin-Sulbactam (UNASYN) 3 g in sodium chloride 0.9 % 100 mL IVPB 3 g 100 mL/hr      Active Problems:   Acute cholecystitis   Leukocytosis   Transaminitis   Depression with anxiety   Gallstones   Cholecystitis   Malnutrition of moderate degree    Time spent: 25 min    Raphel Stickles U [facility]  [facility] Pager [phone]. If 7PM-7AM, please contact night-coverage at [email or web address], password [facility] 12/28/2015, 8:07 AM  LOS: 4 days

## 2015-12-28 NOTE — Progress Notes (Signed)
Workup by Dr. Karilyn Cotaehman pending concerning elevated liver enzyme tests. Reschedule patient for laparoscopic cholecystectomy with cholangiograms for tomorrow.

## 2015-12-28 NOTE — Progress Notes (Signed)
  Subjective:  Patient continues complain of pain at right upper quadrant across lower abdomen. She is willing to undergo MRI again with sedation.  Objective: Blood pressure 143/88, pulse 110, temperature 98.2 F (36.8 C), temperature source Oral, resp. rate 20, height 5\' 7"  (1.702 m), weight 256 lb 14.4 oz (116.529 kg), last menstrual period 12/15/2015, SpO2 97 %. Patient is alert and appears quite anxious. Abdomen is full but soft with mild tenderness at LLQ and RUQ. No organomegaly or masses.  Labs/studies Results:   Recent Labs  12/27/15 0736 12/28/15 0456  WBC 9.2 10.0  HGB 14.1 13.8  HCT 43.1 42.5  PLT 256 264    BMET   Recent Labs  12/27/15 0729 12/28/15 0456  NA 137 139  K 4.2 3.9  CL 105 102  CO2 23 26  GLUCOSE 83 81  BUN <5* 5*  CREATININE 0.64 0.65  CALCIUM 8.7* 8.7*    LFT   Recent Labs  12/27/15 0729 12/28/15 0456  PROT 6.5 6.9  ALBUMIN 3.8 3.8  AST 137* 167*  ALT 293* 330*  ALKPHOS 66 65  BILITOT 2.7* 3.2*  BILIDIR 1.4*  --   IBILI 1.3*  --     MRCP not possible cause of body habitus. Result repeated and bile duct meter is 3 mm and she has fatty liver.  Assessment:  #1. Elevated transaminases which are trending upwards. I do not believe she has choledocholithiasis as of bile duct is single 3 mm. Therefore would not recommend ERCP. She will need liver biopsy at the time of cholecystectomy. Viral markers for hepatitis A B and C are negative. #2. Cholelithiasis and cholecystitis. #3. Abdominal pain most likely secondary to IBS.   Recommendations:  Will check sedimentation rate and LFTs in a.m. Levsin/SL sublingual 4 times a day when necessary. Consider intraoperative cholangiogram and liver biopsy at the time of cholecystectomy.

## 2015-12-29 ENCOUNTER — Inpatient Hospital Stay (HOSPITAL_COMMUNITY): Payer: Self-pay | Admitting: Anesthesiology

## 2015-12-29 ENCOUNTER — Inpatient Hospital Stay (HOSPITAL_COMMUNITY): Payer: MEDICAID | Admitting: Anesthesiology

## 2015-12-29 ENCOUNTER — Encounter (HOSPITAL_COMMUNITY)
Admission: EM | Disposition: A | Discharge status: Home / Self Care | Payer: Self-pay | Source: Home / Self Care | Attending: Internal Medicine

## 2015-12-29 ENCOUNTER — Inpatient Hospital Stay (HOSPITAL_COMMUNITY): Payer: MEDICAID

## 2015-12-29 ENCOUNTER — Encounter (HOSPITAL_COMMUNITY): Payer: Self-pay | Admitting: *Deleted

## 2015-12-29 DIAGNOSIS — K810 Acute cholecystitis: Secondary | ICD-10-CM

## 2015-12-29 DIAGNOSIS — F418 Other specified anxiety disorders: Secondary | ICD-10-CM

## 2015-12-29 DIAGNOSIS — D72829 Elevated white blood cell count, unspecified: Secondary | ICD-10-CM

## 2015-12-29 HISTORY — PX: DIAGNOSTIC LAPAROSCOPIC LIVER BIOPSY: SHX5797

## 2015-12-29 HISTORY — PX: CHOLECYSTECTOMY: SHX55

## 2015-12-29 LAB — CULTURE, BLOOD (ROUTINE X 2)
CULTURE: NO GROWTH
CULTURE: NO GROWTH

## 2015-12-29 LAB — HEPATIC FUNCTION PANEL
ALBUMIN: 3.9 g/dL (ref 3.5–5.0)
ALK PHOS: 65 U/L (ref 38–126)
ALT: 346 U/L — AB (ref 14–54)
AST: 153 U/L — AB (ref 15–41)
Bilirubin, Direct: 1.8 mg/dL — ABNORMAL HIGH (ref 0.1–0.5)
Indirect Bilirubin: 1.5 mg/dL — ABNORMAL HIGH (ref 0.3–0.9)
TOTAL PROTEIN: 7.1 g/dL (ref 6.5–8.1)
Total Bilirubin: 3.3 mg/dL — ABNORMAL HIGH (ref 0.3–1.2)

## 2015-12-29 LAB — CBC
HCT: 41.6 % (ref 36.0–46.0)
HEMOGLOBIN: 13.6 g/dL (ref 12.0–15.0)
MCH: 29.1 pg (ref 26.0–34.0)
MCHC: 32.7 g/dL (ref 30.0–36.0)
MCV: 89.1 fL (ref 78.0–100.0)
PLATELETS: 275 10*3/uL (ref 150–400)
RBC: 4.67 MIL/uL (ref 3.87–5.11)
RDW: 13.5 % (ref 11.5–15.5)
WBC: 10.9 10*3/uL — AB (ref 4.0–10.5)

## 2015-12-29 LAB — SEDIMENTATION RATE: Sed Rate: 16 mm/hr (ref 0–22)

## 2015-12-29 SURGERY — LAPAROSCOPIC CHOLECYSTECTOMY WITH INTRAOPERATIVE CHOLANGIOGRAM
Anesthesia: General

## 2015-12-29 MED ORDER — ONDANSETRON HCL 4 MG/2ML IJ SOLN: 4.0000 mg | Freq: Once | INTRAMUSCULAR | Status: DC | PRN

## 2015-12-29 MED ORDER — DEXAMETHASONE SODIUM PHOSPHATE 4 MG/ML IJ SOLN: 4.0000 mg | Freq: Once | INTRAMUSCULAR | Status: DC

## 2015-12-29 MED ORDER — MIDAZOLAM HCL 2 MG/2ML IJ SOLN
INTRAMUSCULAR | Status: AC
  Filled 2015-12-29: qty 2

## 2015-12-29 MED ORDER — LIDOCAINE HCL (PF) 1 % IJ SOLN
INTRAMUSCULAR | Status: AC
  Filled 2015-12-29: qty 5

## 2015-12-29 MED ORDER — BUPIVACAINE HCL 0.5 % IJ SOLN
INTRAMUSCULAR | Status: DC | PRN
  Administered 2015-12-29: 10 mL

## 2015-12-29 MED ORDER — DEXAMETHASONE SODIUM PHOSPHATE 4 MG/ML IJ SOLN
INTRAMUSCULAR | Status: AC
  Filled 2015-12-29: qty 1

## 2015-12-29 MED ORDER — ONDANSETRON HCL 4 MG/2ML IJ SOLN
4.0000 mg | Freq: Once | INTRAMUSCULAR | Status: AC
  Administered 2015-12-29: 4 mg via INTRAVENOUS

## 2015-12-29 MED ORDER — POVIDONE-IODINE 10 % EX OINT
TOPICAL_OINTMENT | CUTANEOUS | Status: AC
  Filled 2015-12-29: qty 1

## 2015-12-29 MED ORDER — SCOPOLAMINE 1 MG/3DAYS TD PT72
1.0000 | MEDICATED_PATCH | Freq: Once | TRANSDERMAL | Status: DC
  Administered 2015-12-29: 1.5 mg via TRANSDERMAL

## 2015-12-29 MED ORDER — FENTANYL CITRATE (PF) 250 MCG/5ML IJ SOLN
INTRAMUSCULAR | Status: AC
  Filled 2015-12-29: qty 5

## 2015-12-29 MED ORDER — MIDAZOLAM HCL 2 MG/2ML IJ SOLN
1.0000 mg | INTRAMUSCULAR | Status: DC | PRN
  Administered 2015-12-29 (×2): 2 mg via INTRAVENOUS
  Filled 2015-12-29: qty 2

## 2015-12-29 MED ORDER — ONDANSETRON HCL 4 MG/2ML IJ SOLN
INTRAMUSCULAR | Status: AC
  Filled 2015-12-29: qty 2

## 2015-12-29 MED ORDER — LACTATED RINGERS IV SOLN
INTRAVENOUS | Status: DC
  Administered 2015-12-29 – 2015-12-30 (×2): via INTRAVENOUS

## 2015-12-29 MED ORDER — ONDANSETRON HCL 4 MG/2ML IJ SOLN: 4.0000 mg | Freq: Once | INTRAMUSCULAR | Status: DC

## 2015-12-29 MED ORDER — KETOROLAC TROMETHAMINE 30 MG/ML IJ SOLN
30.0000 mg | Freq: Once | INTRAMUSCULAR | Status: AC
  Administered 2015-12-29: 30 mg via INTRAVENOUS
  Filled 2015-12-29: qty 1

## 2015-12-29 MED ORDER — FENTANYL CITRATE (PF) 100 MCG/2ML IJ SOLN
25.0000 ug | INTRAMUSCULAR | Status: DC | PRN
  Administered 2015-12-29 (×2): 25 ug via INTRAVENOUS
  Administered 2015-12-29: 50 ug via INTRAVENOUS

## 2015-12-29 MED ORDER — GLYCOPYRROLATE 0.2 MG/ML IJ SOLN
INTRAMUSCULAR | Status: AC
  Filled 2015-12-29: qty 2

## 2015-12-29 MED ORDER — FENTANYL CITRATE (PF) 100 MCG/2ML IJ SOLN
INTRAMUSCULAR | Status: AC
  Filled 2015-12-29: qty 2

## 2015-12-29 MED ORDER — ROCURONIUM BROMIDE 100 MG/10ML IV SOLN
INTRAVENOUS | Status: DC | PRN
  Administered 2015-12-29: 5 mg via INTRAVENOUS
  Administered 2015-12-29: 25 mg via INTRAVENOUS

## 2015-12-29 MED ORDER — IOHEXOL 350 MG/ML SOLN
INTRAVENOUS | Status: DC | PRN
  Administered 2015-12-29: 20 mL

## 2015-12-29 MED ORDER — MIDAZOLAM HCL 2 MG/2ML IJ SOLN: 1.0000 mg | INTRAMUSCULAR | Status: DC | PRN

## 2015-12-29 MED ORDER — LACTATED RINGERS IV SOLN: INTRAVENOUS | Status: DC

## 2015-12-29 MED ORDER — SUCCINYLCHOLINE CHLORIDE 20 MG/ML IJ SOLN
INTRAMUSCULAR | Status: DC | PRN
  Administered 2015-12-29: 160 mg via INTRAVENOUS

## 2015-12-29 MED ORDER — FENTANYL CITRATE (PF) 100 MCG/2ML IJ SOLN
INTRAMUSCULAR | Status: DC | PRN
  Administered 2015-12-29: 100 ug via INTRAVENOUS
  Administered 2015-12-29: 50 ug via INTRAVENOUS
  Administered 2015-12-29: 100 ug via INTRAVENOUS

## 2015-12-29 MED ORDER — HEMOSTATIC AGENTS (NO CHARGE) OPTIME
TOPICAL | Status: DC | PRN
  Administered 2015-12-29: 1 via TOPICAL

## 2015-12-29 MED ORDER — ROCURONIUM BROMIDE 50 MG/5ML IV SOLN
INTRAVENOUS | Status: AC
Start: 2015-12-29 — End: 2015-12-29
  Filled 2015-12-29: qty 1

## 2015-12-29 MED ORDER — LIDOCAINE HCL 1 % IJ SOLN
INTRAMUSCULAR | Status: DC | PRN
  Administered 2015-12-29: 30 mg via INTRADERMAL

## 2015-12-29 MED ORDER — SCOPOLAMINE 1 MG/3DAYS TD PT72
MEDICATED_PATCH | TRANSDERMAL | Status: AC
  Filled 2015-12-29: qty 1

## 2015-12-29 MED ORDER — LACTATED RINGERS IV SOLN
INTRAVENOUS | Status: DC
  Administered 2015-12-29: 10:00:00 via INTRAVENOUS

## 2015-12-29 MED ORDER — DEXAMETHASONE SODIUM PHOSPHATE 4 MG/ML IJ SOLN
4.0000 mg | Freq: Once | INTRAMUSCULAR | Status: AC
  Administered 2015-12-29: 4 mg via INTRAVENOUS

## 2015-12-29 MED ORDER — MIDAZOLAM HCL 5 MG/5ML IJ SOLN
INTRAMUSCULAR | Status: DC | PRN
  Administered 2015-12-29: 2 mg via INTRAVENOUS

## 2015-12-29 MED ORDER — PROPOFOL 10 MG/ML IV BOLUS
INTRAVENOUS | Status: DC | PRN
  Administered 2015-12-29: 170 mg via INTRAVENOUS

## 2015-12-29 MED ORDER — GLYCOPYRROLATE 0.2 MG/ML IJ SOLN
INTRAMUSCULAR | Status: DC | PRN
  Administered 2015-12-29: 0.4 mg via INTRAVENOUS

## 2015-12-29 MED ORDER — BUPIVACAINE HCL (PF) 0.5 % IJ SOLN
INTRAMUSCULAR | Status: AC
  Filled 2015-12-29: qty 30

## 2015-12-29 MED ORDER — 0.9 % SODIUM CHLORIDE (POUR BTL) OPTIME
TOPICAL | Status: DC | PRN
  Administered 2015-12-29: 1000 mL

## 2015-12-29 MED ORDER — NEOSTIGMINE METHYLSULFATE 10 MG/10ML IV SOLN
INTRAVENOUS | Status: DC | PRN
  Administered 2015-12-29: 2 mg via INTRAVENOUS

## 2015-12-29 MED ORDER — SCOPOLAMINE 1 MG/3DAYS TD PT72: 1.0000 | MEDICATED_PATCH | Freq: Once | TRANSDERMAL | Status: DC

## 2015-12-29 SURGICAL SUPPLY — 51 items
APPLIER CLIP LAPSCP 10X32 DD (CLIP) ×4 IMPLANT
BAG HAMPER (MISCELLANEOUS) ×4 IMPLANT
BAG SPEC RTRVL LRG 6X4 10 (ENDOMECHANICALS) ×2
CATH CHOLANGIOGRAM 4.5FR (CATHETERS) ×4 IMPLANT
CHLORAPREP W/TINT 26ML (MISCELLANEOUS) ×4 IMPLANT
CLOTH BEACON ORANGE TIMEOUT ST (SAFETY) ×4 IMPLANT
COVER LIGHT HANDLE STERIS (MISCELLANEOUS) ×8 IMPLANT
COVER MAYO STAND XLG (DRAPE) ×4 IMPLANT
DECANTER SPIKE VIAL GLASS SM (MISCELLANEOUS) ×4 IMPLANT
DRAPE C-ARM FOLDED MOBILE STRL (DRAPES) ×4 IMPLANT
ELECT REM PT RETURN 9FT ADLT (ELECTROSURGICAL) ×4
ELECTRODE REM PT RTRN 9FT ADLT (ELECTROSURGICAL) ×2 IMPLANT
FILTER SMOKE EVAC LAPAROSHD (FILTER) ×4 IMPLANT
FORMALIN 10 PREFIL 120ML (MISCELLANEOUS) ×4 IMPLANT
GLOVE BIOGEL PI IND STRL 7.0 (GLOVE) ×2 IMPLANT
GLOVE BIOGEL PI INDICATOR 7.0 (GLOVE) ×4
GLOVE ECLIPSE 6.5 STRL STRAW (GLOVE) ×2 IMPLANT
GLOVE ECLIPSE 7.0 STRL STRAW (GLOVE) ×2 IMPLANT
GLOVE SURG SS PI 7.5 STRL IVOR (GLOVE) ×8 IMPLANT
GOWN STRL REUS W/ TWL LRG LVL3 (GOWN DISPOSABLE) ×4 IMPLANT
GOWN STRL REUS W/ TWL XL LVL3 (GOWN DISPOSABLE) ×2 IMPLANT
GOWN STRL REUS W/TWL LRG LVL3 (GOWN DISPOSABLE) ×20 IMPLANT
GOWN STRL REUS W/TWL XL LVL3 (GOWN DISPOSABLE) ×4
HEMOSTAT SNOW SURGICEL 2X4 (HEMOSTASIS) ×4 IMPLANT
INST SET LAPROSCOPIC AP (KITS) ×4 IMPLANT
KIT ROOM TURNOVER APOR (KITS) ×4 IMPLANT
MANIFOLD NEPTUNE II (INSTRUMENTS) ×4 IMPLANT
NDL BIOPSY 14X6 SOFT TISS (NEEDLE) IMPLANT
NEEDLE BIOPSY 14X6 SOFT TISS (NEEDLE) ×4 IMPLANT
NEEDLE INSUFFLATION 120MM (ENDOMECHANICALS) ×4 IMPLANT
NS IRRIG 1000ML POUR BTL (IV SOLUTION) ×4 IMPLANT
PACK LAP CHOLE LZT030E (CUSTOM PROCEDURE TRAY) ×4 IMPLANT
PAD ARMBOARD 7.5X6 YLW CONV (MISCELLANEOUS) ×4 IMPLANT
PAD TELFA 3X4 1S STER (GAUZE/BANDAGES/DRESSINGS) ×2 IMPLANT
POUCH SPECIMEN RETRIEVAL 10MM (ENDOMECHANICALS) ×4 IMPLANT
SET BASIN LINEN APH (SET/KITS/TRAYS/PACK) ×4 IMPLANT
SLEEVE ENDOPATH XCEL 5M (ENDOMECHANICALS) ×4 IMPLANT
SPONGE GAUZE 2X2 8PLY STER LF (GAUZE/BANDAGES/DRESSINGS) ×4
SPONGE GAUZE 2X2 8PLY STRL LF (GAUZE/BANDAGES/DRESSINGS) ×12 IMPLANT
STAPLER VISISTAT (STAPLE) ×4 IMPLANT
SUT VICRYL 0 UR6 27IN ABS (SUTURE) ×4 IMPLANT
SYR 20CC LL (SYRINGE) ×4 IMPLANT
SYR 30ML LL (SYRINGE) ×4 IMPLANT
SYR CONTROL 10ML LL (SYRINGE) ×4 IMPLANT
TAPE CLOTH SURG 4X10 WHT LF (GAUZE/BANDAGES/DRESSINGS) ×2 IMPLANT
TROCAR ENDO BLADELESS 11MM (ENDOMECHANICALS) ×4 IMPLANT
TROCAR XCEL NON-BLD 5MMX100MML (ENDOMECHANICALS) ×4 IMPLANT
TROCAR XCEL UNIV SLVE 11M 100M (ENDOMECHANICALS) ×4 IMPLANT
TUBING INSUFFLATION (TUBING) ×4 IMPLANT
WARMER LAPAROSCOPE (MISCELLANEOUS) ×4 IMPLANT
YANKAUER SUCT 12FT TUBE ARGYLE (SUCTIONS) ×4 IMPLANT

## 2015-12-29 NOTE — Op Note (Signed)
Patient:  Hannah Lambert  DOB:  02-Jul-1986  MRN:  [id number]   Preop Diagnosis:  Cholecystitis, cholelithiasis, hyperbilirubinemia  Postop Diagnosis:  Same  Procedure:  Laparoscopic cholecystectomy with intraoperative cholangiograms, liver biopsy  Surgeon:  Franky MachoMark Hosanna Betley, M.D.  Anes:  Gen. endotracheal  Indications:  Patient is a 30 year old white female who presented to the emergency room with worsening right upper quadrant abdominal pain. Ultrasound the gallbladder revealed cholelithiasis, though the study was limited secondary to body habitus. Her liver enzyme tests are significantly elevated. An attempt at the preoperative MRCP was unsuccessful. The patient now comes to the operating room for lap scopic cholecystectomy with intraoperative cholangiograms, liver biopsy. The risks and benefits of the procedures including bleeding, infection, hepatobiliary injury, and the possibility of an open procedure were fully explained to the patient, who gave informed consent.  Procedure note:  The patient was placed the supine position. After induction of general endotracheal anesthesia, the abdomen was prepped and draped using usual sterile technique with DuraPrep. Surgical site confirmation was performed.  The supraumbilical incision was made down to the fascia. A Veress needle was introduced into the abdominal cavity and confirmation of placement was done using the saline drop test. The abdomen was then insufflated to 16 mmHg pressure. An 11 mm trocar was introduced into the abdominal cavity under direct visualization without difficulty. The patient was placed in reverse Trendelenburg position and additional 11 mm trocar was placed the epigastric region and 5 mm trochars were placed the right upper quadrant and right flank regions. The liver was inspected and noted to be within normal limits. A Tru-Cut needle biopsy of the right lobe liver was performed and sent to pathology further examination. The  gallbladder did not appear significantly inflamed. The gallbladder was retracted in a dynamic fashion in order to expose the triangle of Calot. The cystic duct was first identified. Its junction to the infundibulum was fully identified. A single Endo Clip was placed high on the cystic duct. An incision was made and cholangiocatheter was inserted. Using digital fluoroscopy, a cholangiogram was performed. The hepatobiliary tree was not dilated. The diaphragm freely into the duodenum. No filling defects were noted. The system was flushed with saline. Multiple endoclips were placed distally on the cystic duct, and the cystic duct was divided. The cystic artery was likewise ligated and divided. The gallbladder was freed away from the gallbladder fossa using Bovie cautery. Gallbladder was delivered through the epigastric trocar site using an Endo Catch bag. The gallbladder fossa was inspected and no abnormal bleeding or bile leakage was noted. Surgicel was placed the gallbladder fossa. All fluid and air were then evacuated from the abdominal cavity prior to removal of the trochars.  All wounds were irrigated with normal saline. All wounds were injected with 0.5% Sensorcaine. The supraumbilical fascia was reapproximated using 0 Vicryl interrupted sutures. All skin incisions were closed using staples. Betadine ointment and dry sterile dressings were applied.  All tape and needle counts were correct at the end of the procedure. The patient was extubated in the operating room and transferred to PACU in stable condition.  Complications:  None  EBL:  25 mL  Specimen:  Gallbladder, liver biopsy

## 2015-12-29 NOTE — Anesthesia Preprocedure Evaluation (Signed)
Anesthesia Evaluation  Patient identified by MRN, date of birth, ID band Patient awake    Airway Mallampati: II  TM Distance: >3 FB     Dental  (+) Teeth Intact, Dental Advisory Given   Pulmonary asthma , former smoker,    breath sounds clear to auscultation       Cardiovascular negative cardio ROS   Rhythm:Regular Rate:Normal     Neuro/Psych PSYCHIATRIC DISORDERS Anxiety Depression    GI/Hepatic Transaminitis Cholecystitis    Endo/Other  Morbid obesity  Renal/GU      Musculoskeletal   Abdominal   Peds  Hematology   Anesthesia Other Findings   Reproductive/Obstetrics                             Anesthesia Physical Anesthesia Plan  ASA: II  Anesthesia Plan: General   Post-op Pain Management:    Induction: Intravenous, Rapid sequence and Cricoid pressure planned  Airway Management Planned: Oral ETT  Additional Equipment:   Intra-op Plan:   Post-operative Plan: Extubation in OR  Informed Consent: I have reviewed the patients History and Physical, chart, labs and discussed the procedure including the risks, benefits and alternatives for the proposed anesthesia with the patient or authorized representative who has indicated his/her understanding and acceptance.     Plan Discussed with:   Anesthesia Plan Comments:         Anesthesia Quick Evaluation

## 2015-12-29 NOTE — Progress Notes (Signed)
Operative findings noted. Intraoperative cholangiogram is normal with low take off to cystic duct. Sedimentation rate was 17. LFTs will be repeated in a.m. and will review liver biopsy with pathologist when his rate. Patient is alert and complains of pain and right upper quadrant and she is trying to eat her supper.

## 2015-12-29 NOTE — Anesthesia Procedure Notes (Signed)
Procedure Name: Intubation Date/Time: 12/29/2015 11:18 AM Performed by: Despina HiddenIDACAVAGE, Vivica Dobosz J Pre-anesthesia Checklist: Emergency Drugs available, Patient identified, Suction available and Patient being monitored Patient Re-evaluated:Patient Re-evaluated prior to inductionOxygen Delivery Method: Circle system utilized Preoxygenation: Pre-oxygenation with 100% oxygen Intubation Type: IV induction, Rapid sequence and Cricoid Pressure applied Ventilation: Mask ventilation without difficulty and Oral airway inserted - appropriate to patient size Laryngoscope Size: Mac and 3 Grade View: Grade II Tube type: Oral Tube size: 7.0 mm Number of attempts: 1 Airway Equipment and Method: Stylet and Oral airway Placement Confirmation: ETT inserted through vocal cords under direct vision,  positive ETCO2 and breath sounds checked- equal and bilateral Secured at: 21 cm Tube secured with: Tape Dental Injury: Teeth and Oropharynx as per pre-operative assessment

## 2015-12-29 NOTE — Progress Notes (Signed)
PROGRESS NOTE  Hannah Lambert ZOX:[id number] DOB: 05/20/1986 DOA: 12/24/2015 PCP: No PCP Per Patient  Assessment/Plan: Acute cholecystitis:  -Ultrasound somewhat limited due to body habitus but gallbladder sludge and potential small stones appreciated. Gallbladder wall thickness at upper level of normal. AST 157, ALT 308, total bilirubin 1.9, lipase 20, WBC 13.1, AF VSS. Positive Murphy sign on exam.  - appreciate: Dr. Lovell Sheehan: Plan was for  laparoscopic cholecystectomy with possible intraoperative cholangiogram 3/13 but LFTs trending up so will do MRCP and possible ERCP first --- patient was unable to complete MRCP As she said her IV "blew" -She is now status post laparoscopic Cholecystectomy. -appreciate GI consult - Unasyn, BCX - IVF  Elevated LFTs-trending up -hepatitis panel negative -see above  Depression/Anxiety: no home medications - Low dose Ativan PRN  hypomagnesium -repleted  Code Status: full Family Communication: Discussed with family at bedside Disposition Plan: Dr. Lovell Sheehan has graciously agreed to transfer the patient to his service. Hospitalists will be available if needed. Will sign off.   Consultants:  Surgery- Dr. Lovell Sheehan  GI  Procedures:      HPI/Subjective: Patient seen in her room after surgery. She reports severe pain in her right upper quadrant. She also feels anxious.  Objective: Filed Vitals:   12/29/15 1315 12/29/15 1325  BP: 141/78 149/80  Pulse:  90  Temp:  97.6 F (36.4 C)  Resp:  22    Intake/Output Summary (Last 24 hours) at 12/29/15 1333 Last data filed at 12/29/15 1210  Gross per 24 hour  Intake 2668.33 ml  Output    375 ml  Net 2293.33 ml   Filed Weights   12/24/15 1345 12/24/15 2204 12/29/15 0925  Weight: 116.257 kg (256 lb 4.8 oz) 116.529 kg (256 lb 14.4 oz) 116.121 kg (256 lb)    Exam:   General:  NAD  Cardiovascular:regular rate and rhythm  Respiratory: cta b  Abdomen: soft, non distended, dressings  over surgical sites  Musculoskeletal: no edema   Data Reviewed: Basic Metabolic Panel:  Recent Labs Lab 12/24/15 1422 12/24/15 2238 12/25/15 0616 12/27/15 0729 12/28/15 0456  NA 141  --  141 137 139  K 3.9  --  3.5 4.2 3.9  CL 105  --  109 105 102  CO2 23  --  GLUCOSE 95  --  81 83 81  BUN 9  --  10 <5* 5*  CREATININE 0.74  --  0.68 0.64 0.65  CALCIUM 9.7  --  8.3* 8.7* 8.7*  MG  --  1.6*  --   --   --   PHOS  --  3.4  --   --   --    Liver Function Tests:  Recent Labs Lab 12/24/15 1422 12/25/15 0616 12/27/15 0729 12/28/15 0456 12/29/15 0415  AST 157* 90* 137* 167* 153*  ALT 308* 214* 293* 330* 346*  ALKPHOS 74 60 66 65 65  BILITOT 1.9* 1.8* 2.7* 3.2* 3.3*  PROT 8.6* 6.6 6.5 6.9 7.1  ALBUMIN 4.8 3.8 3.8 3.8 3.9    Recent Labs Lab 12/24/15 1422  LIPASE 20   No results for input(s): AMMONIA in the last 168 hours. CBC:  Recent Labs Lab 12/24/15 1422 12/25/15 0616 12/27/15 0736 12/28/15 0456 12/29/15 0415  WBC 13.1* 11.2* 9.2 10.0 10.9*  NEUTROABS 9.6*  --   --   --   --   HGB 16.4* 13.6 14.1 13.8 13.6  HCT 48.7* 41.4 43.1 42.5 41.6  MCV 86.3  87.9 88.9 89.3 89.1  PLT 361 274 256 264 275   Cardiac Enzymes: No results for input(s): CKTOTAL, CKMB, CKMBINDEX, TROPONINI in the last 168 hours. BNP (last 3 results) No results for input(s): BNP in the last 8760 hours.  ProBNP (last 3 results) No results for input(s): PROBNP in the last 8760 hours.  CBG: No results for input(s): GLUCAP in the last 168 hours.  Recent Results (from the past 240 hour(s))  Culture, blood (routine x 2)     Status: None   Collection Time: 12/24/15 10:38 PM  Result Value Ref Range Status   Specimen Description BLOOD LEFT ANTECUBITAL  Final   Special Requests BOTTLES DRAWN AEROBIC AND ANAEROBIC 4CC EACH  Final   Culture NO GROWTH 5 DAYS  Final   Report Status 12/29/2015 FINAL  Final  Culture, blood (routine x 2)     Status: None   Collection Time: 12/24/15  10:49 PM  Result Value Ref Range Status   Specimen Description BLOOD LEFT HAND  Final   Special Requests BOTTLES DRAWN AEROBIC AND ANAEROBIC 4CC EACH  Final   Culture NO GROWTH 5 DAYS  Final   Report Status 12/29/2015 FINAL  Final  Surgical pcr screen     Status: None   Collection Time: 12/26/15  4:41 PM  Result Value Ref Range Status   MRSA, PCR NEGATIVE NEGATIVE Final   Staphylococcus aureus NEGATIVE NEGATIVE Final    Comment:        The Xpert SA Assay (FDA approved for NASAL specimens in patients over 28 years of age), is one component of a comprehensive surveillance program.  Test performance has been validated by [facility] for patients greater than or equal to 28 year old. It is not intended to diagnose infection nor to guide or monitor treatment.      Studies: [place] Abdomen Limited Ruq  12/28/2015  CLINICAL DATA:  Abnormal LFTs EXAM: US ABDOMEN LIMITED - RIGHT UPPER QUADRANT COMPARISON:  None FINDINGS: Gallbladder: Gallbladder filled with sludge. No shadowing calculi. No gallbladder wall thickening or pericholecystic fluid. No sonographic Murphy sign. Common bile duct: Diameter: Normal caliber 3 mm diameter Liver: Echogenic, likely fatty infiltration, though this can be seen with cirrhosis and certain infiltrative disorders. No focal hepatic mass or nodularity identified though intrahepatic assessment is limited by sound attenuation. No RIGHT upper quadrant free fluid. IMPRESSION: Gallbladder filled with sludge without definite shadowing calculi. No biliary dilatation. Probable fatty infiltration of liver as above. Electronically Signed   By: Ulyses SouthwardMark  Boles M.D.   On: 12/28/2015 09:48    Scheduled Meds: . ampicillin-sulbactam (UNASYN) IV  3 g Intravenous Q6H  . enoxaparin (LOVENOX) injection  40 mg Subcutaneous Q24H   Continuous Infusions: . lactated ringers     Antibiotics Given (last 72 hours)    Date/Time Action Medication Dose Rate   12/26/15 1517 Given    Ampicillin-Sulbactam (UNASYN) 3 g in sodium chloride 0.9 % 100 mL IVPB 3 g 100 mL/hr   12/26/15 2158 Given   Ampicillin-Sulbactam (UNASYN) 3 g in sodium chloride 0.9 % 100 mL IVPB 3 g 100 mL/hr   12/27/15 0311 Given   Ampicillin-Sulbactam (UNASYN) 3 g in sodium chloride 0.9 % 100 mL IVPB 3 g 100 mL/hr   12/27/15 0853 Given   Ampicillin-Sulbactam (UNASYN) 3 g in sodium chloride 0.9 % 100 mL IVPB 3 g 100 mL/hr   12/27/15 1602 Given   Ampicillin-Sulbactam (UNASYN) 3 g in sodium chloride 0.9 % 100 mL IVPB 3  g 100 mL/hr   12/27/15 2055 Given   Ampicillin-Sulbactam (UNASYN) 3 g in sodium chloride 0.9 % 100 mL IVPB 3 g 100 mL/hr   12/28/15 0241 Given   Ampicillin-Sulbactam (UNASYN) 3 g in sodium chloride 0.9 % 100 mL IVPB 3 g 100 mL/hr   12/28/15 1048 Given   Ampicillin-Sulbactam (UNASYN) 3 g in sodium chloride 0.9 % 100 mL IVPB 3 g 100 mL/hr   12/28/15 1454 Given   Ampicillin-Sulbactam (UNASYN) 3 g in sodium chloride 0.9 % 100 mL IVPB 3 g 100 mL/hr   12/28/15 2000 Given   Ampicillin-Sulbactam (UNASYN) 3 g in sodium chloride 0.9 % 100 mL IVPB 3 g 100 mL/hr   12/29/15 0300 Given   Ampicillin-Sulbactam (UNASYN) 3 g in sodium chloride 0.9 % 100 mL IVPB 3 g 100 mL/hr   12/29/15 1027 Given   Ampicillin-Sulbactam (UNASYN) 3 g in sodium chloride 0.9 % 100 mL IVPB 3 g 100 mL/hr      Active Problems:   Acute cholecystitis   Leukocytosis   Transaminitis   Depression with anxiety   Gallstones   Cholecystitis   Malnutrition of moderate degree    Time spent: 25 min    MEMON,JEHANZEB  [facility] Pager [phone]. If 7PM-7AM, please contact night-coverage at [email or web address], password [facility] 12/29/2015, 1:33 PM  LOS: 5 days

## 2015-12-29 NOTE — Anesthesia Postprocedure Evaluation (Signed)
Anesthesia Post Note  Patient: Mertha FindersBrooke N Veltri  Procedure(s) Performed: Procedure(s) (LRB): LAPAROSCOPIC CHOLECYSTECTOMY WITH INTRAOPERATIVE CHOLANGIOGRAM (N/A) LAPAROSCOPIC LIVER BIOPSY  Patient location during evaluation: PACU Anesthesia Type: General Level of consciousness: awake and alert, oriented and patient cooperative Pain management: pain level not controlled Vital Signs Assessment: post-procedure vital signs reviewed and stable Respiratory status: spontaneous breathing, nonlabored ventilation and patient connected to face mask oxygen Cardiovascular status: blood pressure returned to baseline and stable Postop Assessment: no signs of nausea or vomiting Anesthetic complications: no    Last Vitals:  Filed Vitals:   12/29/15 1105 12/29/15 1215  BP: 140/98   Pulse:    Temp:  36.6 C  Resp: 16     Last Pain:  Filed Vitals:   12/29/15 1218  PainSc: 7                  Vrishank Moster J

## 2015-12-29 NOTE — Transfer of Care (Signed)
Immediate Anesthesia Transfer of Care Note  Patient: Hannah Lambert  Procedure(s) Performed: Procedure(s): LAPAROSCOPIC CHOLECYSTECTOMY WITH INTRAOPERATIVE CHOLANGIOGRAM (N/A) LAPAROSCOPIC LIVER BIOPSY  Patient Location: PACU  Anesthesia Type:General  Level of Consciousness: awake and patient cooperative  Airway & Oxygen Therapy: Patient Spontanous Breathing and Patient connected to face mask oxygen  Post-op Assessment: Report given to RN, Post -op Vital signs reviewed and stable and Patient moving all extremities  Post vital signs: Reviewed and stable  Last Vitals:  Filed Vitals:   12/29/15 1100 12/29/15 1105  BP: 135/83 140/98  Pulse:    Temp:    Resp: 12 16    Complications: No apparent anesthesia complications

## 2015-12-30 ENCOUNTER — Encounter (HOSPITAL_COMMUNITY): Payer: Self-pay | Admitting: General Surgery

## 2015-12-30 LAB — BASIC METABOLIC PANEL
Anion gap: 9 (ref 5–15)
BUN: 7 mg/dL (ref 6–20)
CALCIUM: 8.5 mg/dL — AB (ref 8.9–10.3)
CO2: 24 mmol/L (ref 22–32)
CREATININE: 0.75 mg/dL (ref 0.44–1.00)
Chloride: 106 mmol/L (ref 101–111)
GFR calc non Af Amer: >60 mL/min (ref >60)
Glucose, Bld: 108 mg/dL — ABNORMAL HIGH (ref 65–99)
Potassium: 3.8 mmol/L (ref 3.5–5.1)
SODIUM: 139 mmol/L (ref 135–145)

## 2015-12-30 LAB — CBC
HCT: 39.7 % (ref 36.0–46.0)
Hemoglobin: 13.2 g/dL (ref 12.0–15.0)
MCH: 29.4 pg (ref 26.0–34.0)
MCHC: 33.2 g/dL (ref 30.0–36.0)
MCV: 88.4 fL (ref 78.0–100.0)
PLATELETS: 259 10*3/uL (ref 150–400)
RBC: 4.49 MIL/uL (ref 3.87–5.11)
RDW: 13.6 % (ref 11.5–15.5)
WBC: 13.3 10*3/uL — AB (ref 4.0–10.5)

## 2015-12-30 LAB — HEPATIC FUNCTION PANEL
ALT: 500 U/L — AB (ref 14–54)
AST: 259 U/L — ABNORMAL HIGH (ref 15–41)
Albumin: 3.5 g/dL (ref 3.5–5.0)
Alkaline Phosphatase: 77 U/L (ref 38–126)
BILIRUBIN INDIRECT: 1 mg/dL — AB (ref 0.3–0.9)
Bilirubin, Direct: 0.8 mg/dL — ABNORMAL HIGH (ref 0.1–0.5)
TOTAL PROTEIN: 6.6 g/dL (ref 6.5–8.1)
Total Bilirubin: 1.8 mg/dL — ABNORMAL HIGH (ref 0.3–1.2)

## 2015-12-30 NOTE — Addendum Note (Signed)
Addendum  created 12/30/15 1157 by Earleen NewportAmy A Elyshia Kumagai, CRNA   Modules edited: Clinical Notes   Clinical Notes:  File: [id number]

## 2015-12-30 NOTE — Anesthesia Postprocedure Evaluation (Signed)
Anesthesia Post Note  Patient: Hannah Lambert  Procedure(s) Performed: Procedure(s) (LRB): LAPAROSCOPIC CHOLECYSTECTOMY WITH INTRAOPERATIVE CHOLANGIOGRAM (N/A) LAPAROSCOPIC LIVER BIOPSY  Patient location during evaluation: Nursing Unit Anesthesia Type: General Level of consciousness: awake and alert and oriented Pain management: pain level controlled Vital Signs Assessment: post-procedure vital signs reviewed and stable Respiratory status: spontaneous breathing Cardiovascular status: stable Postop Assessment: no signs of nausea or vomiting Anesthetic complications: no    Last Vitals:  Filed Vitals:   12/29/15 2101 12/30/15 0507  BP: 129/81 145/82  Pulse: 115 102  Temp: 36.9 C 36.8 C  Resp: 23 22    Last Pain:  Filed Vitals:   12/30/15 1150  PainSc: 0-No pain                 ADAMS, AMY A

## 2015-12-30 NOTE — Progress Notes (Signed)
1 Day Post-Op  Subjective: Patient with moderate incisional pain. Still very anxious.  Objective: Vital signs in last 24 hours: Temp:  [97.6 F (36.4 C)-98.8 F (37.1 C)] 98.2 F (36.8 C) (03/16 0507) Pulse Rate:  [81-121] 102 (03/16 0507) Resp:  [0-26] 22 (03/16 0507) BP: (129-158)/(78-109) 145/82 mmHg (03/16 0507) SpO2:  [91 %-100 %] 95 % (03/16 0507) Weight:  [116.121 kg (256 lb)] 116.121 kg (256 lb) (03/15 0925) Last BM Date: 12/26/15  Intake/Output from previous day: 03/15 0701 - 03/16 0700 In: 1100 [I.V.:1100] Out: 25 [Blood:25] Intake/Output this shift:    General appearance: alert, cooperative and no distress Resp: clear to auscultation bilaterally Cardio: regular rate and rhythm, S1, S2 normal, no murmur, click, rub or gallop GI: Soft, incisions healing well.  Lab Results:   Recent Labs  12/29/15 0415 12/30/15 0616  WBC 10.9* 13.3*  HGB 13.6 13.2  HCT 41.6 39.7  PLT 275 259   BMET  Recent Labs  12/28/15 0456 12/30/15 0616  NA 139 139  K 3.9 3.8  CL 102 106  CO2 26 24  GLUCOSE 81 108*  BUN 5* 7  CREATININE 0.65 0.75  CALCIUM 8.7* 8.5*   PT/INR No results for input(s): LABPROT, INR in the last 72 hours.  Studies/Results: Dg Cholangiogram Operative  12/29/2015  CLINICAL DATA:  30 year old female with cholecystitis EXAM: INTRAOPERATIVE CHOLANGIOGRAM TECHNIQUE: Cholangiographic images from the C-arm fluoroscopic device were submitted for interpretation post-operatively. Please see the procedural report for the amount of contrast and the fluoroscopy time utilized. COMPARISON:  Abdominal ultrasound 12/28/2015 FINDINGS: Cine clip and intraoperative spot image obtained at the time of laparoscopic cholecystectomy with intraoperative cholangiogram demonstrates cannulation of the cystic duct remnant and opacification of the biliary tree. There is no evidence of biliary ductal dilatation, stenosis, stricture or choledocholithiasis. Contrast material passes  freely through the ampulla and into the duodenum. There is some reflux of contrast material into the main pancreatic duct which also appears unremarkable. IMPRESSION: Negative intraoperative cholangiogram. Electronically Signed   By: Malachy MoanHeath  McCullough M.D.   On: 12/29/2015 14:21   [place] Abdomen Limited Ruq  12/28/2015  CLINICAL DATA:  Abnormal LFTs EXAM: US ABDOMEN LIMITED - RIGHT UPPER QUADRANT COMPARISON:  None FINDINGS: Gallbladder: Gallbladder filled with sludge. No shadowing calculi. No gallbladder wall thickening or pericholecystic fluid. No sonographic Murphy sign. Common bile duct: Diameter: Normal caliber 3 mm diameter Liver: Echogenic, likely fatty infiltration, though this can be seen with cirrhosis and certain infiltrative disorders. No focal hepatic mass or nodularity identified though intrahepatic assessment is limited by sound attenuation. No RIGHT upper quadrant free fluid. IMPRESSION: Gallbladder filled with sludge without definite shadowing calculi. No biliary dilatation. Probable fatty infiltration of liver as above. Electronically Signed   By: Ulyses SouthwardMark  Boles M.D.   On: 12/28/2015 09:48    Anti-infectives: Anti-infectives    Start     Dose/Rate Route Frequency Ordered Stop   12/24/15 2145  Ampicillin-Sulbactam (UNASYN) 3 g in sodium chloride 0.9 % 100 mL IVPB     3 g 100 mL/hr over 60 Minutes Intravenous Every 6 hours 12/24/15 2130        Assessment/Plan: s/p Procedure(s): LAPAROSCOPIC CHOLECYSTECTOMY WITH INTRAOPERATIVE CHOLANGIOGRAM LAPAROSCOPIC LIVER BIOPSY Impression: Stable on postoperative day 1. Patient still anxious. Bilirubin has decreased. Elevated liver enzyme tests may be secondary to irritation from cholangiogram.  Ran: Diet has been advanced. Will monitor patient for next 24 hours. Anticipate discharge tomorrow.  LOS: 6 days    Hannah Lambert A 12/30/2015

## 2015-12-30 NOTE — Progress Notes (Signed)
  Subjective:  Patient feels better. She complains of pain in right upper quadrant which she feels is surgical pain. She is not nauseated and ate most of her breakfast and some before lunch.  Objective: Blood pressure 145/82, pulse 102, temperature 98.2 F (36.8 C), temperature source Oral, resp. rate 22, height 5\' 7"  (1.702 m), weight 256 lb (116.121 kg), last menstrual period 12/15/2015, SpO2 95 %. Patient is alert and in no acute distress. Abdomen is full and soft with mild tenderness in periumbilical region and below the right costal margin but no guarding organomegaly noted.  No LE edema or clubbing noted.  Labs/studies Results:   Recent Labs  12/28/15 0456 12/29/15 0415 12/30/15 0616  WBC 10.0 10.9* 13.3*  HGB 13.8 13.6 13.2  HCT 42.5 41.6 39.7  PLT 264 275 259    BMET   Recent Labs  12/28/15 0456 12/30/15 0616  NA 139 139  K 3.9 3.8  CL 102 106  CO2 26 24  GLUCOSE 81 108*  BUN 5* 7  CREATININE 0.65 0.75  CALCIUM 8.7* 8.5*    LFT   Recent Labs  12/28/15 0456 12/29/15 0415 12/30/15 0616  PROT 6.9 7.1 6.6  ALBUMIN 3.8 3.9 3.5  AST 167* 153* 259*  ALT 330* 346* 500*  ALKPHOS 65 65 77  BILITOT 3.2* 3.3* 1.8*  BILIDIR  --  1.8* 0.8*  IBILI  --  1.5* 1.0*     Assessment:  #1. Elevated transaminases. Transaminases are up since yesterday and this may be the result of anesthesia/surgery. Preliminary results on liver biopsy reveals fatty liver and cholestasis most likely secondary to choledocholithiasis with spontaneous resolution.   Recommendations:  Repeat LFTs in a.m.

## 2015-12-31 LAB — CBC
HEMATOCRIT: 37.7 % (ref 36.0–46.0)
Hemoglobin: 12.2 g/dL (ref 12.0–15.0)
MCH: 29.3 pg (ref 26.0–34.0)
MCHC: 32.4 g/dL (ref 30.0–36.0)
MCV: 90.6 fL (ref 78.0–100.0)
Platelets: 228 10*3/uL (ref 150–400)
RBC: 4.16 MIL/uL (ref 3.87–5.11)
RDW: 13.9 % (ref 11.5–15.5)
WBC: 9.9 10*3/uL (ref 4.0–10.5)

## 2015-12-31 LAB — HEPATIC FUNCTION PANEL
ALK PHOS: 65 U/L (ref 38–126)
ALT: 294 U/L — AB (ref 14–54)
AST: 95 U/L — ABNORMAL HIGH (ref 15–41)
Albumin: 3.1 g/dL — ABNORMAL LOW (ref 3.5–5.0)
BILIRUBIN DIRECT: 0.5 mg/dL (ref 0.1–0.5)
BILIRUBIN INDIRECT: 0.7 mg/dL (ref 0.3–0.9)
BILIRUBIN TOTAL: 1.2 mg/dL (ref 0.3–1.2)
Total Protein: 5.7 g/dL — ABNORMAL LOW (ref 6.5–8.1)

## 2015-12-31 MED ORDER — OXYCODONE HCL 5 MG PO TABS: 5.0000 mg | ORAL_TABLET | ORAL | Status: DC | PRN

## 2015-12-31 NOTE — Care Management Note (Signed)
Case Management Note  Patient Details  Name: Barbaraann BoysBrooke N Bogie MRN: [id number] Date of Birth: 1986-10-06  Subjective/Objective:                  Pt admitted with cholecystitis. Pt is from home and is ind with ADL's. Pt is uninsured and [facility]. Pt has undergone lap chole and will f/u with surgery. Pt asked not to have f/u appointment made at the health dept, says she will make one if she feels she needs it. Pt only Rx pain meds and MATCH voucher will not be useful. Pt understands.   Action/Plan: No further CM needs at this time.   Expected Discharge Date:  12/25/15               Expected Discharge Plan:  Home/Self Care  In-House Referral:  Financial Counselor  Discharge planning Services  CM Consult  Post Acute Care Choice:  NA Choice offered to:  NA  DME Arranged:    DME Agency:     HH Arranged:    HH Agency:     Status of Service:  Completed, signed off  Medicare Important Message Given:    Date Medicare IM Given:    Medicare IM give by:    Date Additional Medicare IM Given:    Additional Medicare Important Message give by:     If discussed at Long Length of Stay Meetings, dates discussed:    Additional Comments:  Malcolm MetroChildress, Brooklin Rieger Demske, RN 12/31/2015, 10:38 AM

## 2015-12-31 NOTE — Discharge Planning (Signed)
Pt being discharged home, no c/o pain, vitals stable, all paperwork sent with pt , no questions for nurse

## 2015-12-31 NOTE — Discharge Summary (Signed)
Physician Discharge Summary  Patient ID: Hannah Lambert MRN: [id number] DOB/AGE: 13-Jul-1986 30 y.o.  Admit date: 12/24/2015 Discharge date: 12/31/2015  Admission Diagnoses: Cholecystitis, cholelithiasis  Discharge Diagnoses: Same Active Problems:   Acute cholecystitis   Leukocytosis   Transaminitis   Depression with anxiety   Gallstones   Cholecystitis   Malnutrition of moderate degree   Discharged Condition: good  Hospital Course: Patient is a 30 year old white female who presented emergency room with worsening right upper quadrant abdominal pain and elevated liver enzyme tests. She was admitted to the hospital for further evaluation and treatment. She was found to have cholecystitis with cholelithiasis. There was also the question of possible choledocholithiasis. GI was consulted. Patient did not tolerate an MRCP. She subsequently underwent a laparoscopic cholecystectomy with intraoperative cholangiograms, liver biopsy on 12/29/2015. No choledocholithiasis was seen. She tolerated the procedure well. Her postoperative course has been remarkable for anxiety and incisional pain. Her liver enzyme tests are normalizing. She is being discharged home on 12/31/2015 in good and improving condition.  Consults: GI  Treatments: surgery: Laparoscopic cholecystectomy with intraoperative cholangiograms, liver biopsy on 12/29/2015  Discharge Exam: Blood pressure 127/83, pulse 91, temperature 98 F (36.7 C), temperature source Oral, resp. rate 18, height 5\' 7"  (1.702 m), weight 116.121 kg (256 lb), last menstrual period 12/15/2015, SpO2 97 %. General appearance: alert, cooperative and no distress Resp: clear to auscultation bilaterally Cardio: regular rate and rhythm, S1, S2 normal, no murmur, click, rub or gallop GI: Soft, incisions healing well.  Disposition: 01-Home or Self Care     Medication List    TAKE these medications        nitrofurantoin (macrocrystal-monohydrate) 100 MG  capsule  Commonly known as:  MACROBID  Take 1 capsule (100 mg total) by mouth 2 (two) times daily.     ondansetron 4 MG disintegrating tablet  Commonly known as:  ZOFRAN ODT  Take 1 tablet (4 mg total) by mouth every 8 (eight) hours as needed for nausea or vomiting.     oxyCODONE 5 MG immediate release tablet  Commonly known as:  Oxy IR/ROXICODONE  Take 1 tablet (5 mg total) by mouth every 4 (four) hours as needed for moderate pain.     promethazine 25 MG tablet  Commonly known as:  PHENERGAN  Take 1 tablet (25 mg total) by mouth every 6 (six) hours as needed for nausea or vomiting.     traMADol 50 MG tablet  Commonly known as:  ULTRAM  Take 1 tablet (50 mg total) by mouth every 6 (six) hours as needed.           Follow-up Information    Follow up with Dalia HeadingJENKINS,Arnice Vanepps A, MD. Schedule an appointment as soon as possible for a visit on 01/06/2016.   Specialty:  General Surgery   Contact information:   [address] [place] [id number] [phone]       Signed: Franky MachoJENKINS,Darrell Hauk A 12/31/2015, 8:36 AM

## 2015-12-31 NOTE — Discharge Instructions (Signed)
Laparoscopic Cholecystectomy, Care After °Refer to this sheet in the next few weeks. These instructions provide you with information about caring for yourself after your procedure. Your health care provider may also give you more specific instructions. Your treatment has been planned according to current medical practices, but problems sometimes occur. Call your health care provider if you have any problems or questions after your procedure. °WHAT TO EXPECT AFTER THE PROCEDURE °After your procedure, it is common to have: °· Pain at your incision sites. You will be given pain medicines to control your pain. °· Mild nausea or vomiting. This should improve after the first 24 hours. °· Bloating and possible shoulder pain from the gas that was used during the procedure. This will improve after the first 24 hours. °HOME CARE INSTRUCTIONS °Incision Care °· Follow instructions from your health care provider about how to take care of your incisions. Make sure you: °¨ Wash your hands with soap and water before you change your bandage (dressing). If soap and water are not available, use hand sanitizer. °¨ Change your dressing as told by your health care provider. °¨ Leave stitches (sutures), skin glue, or adhesive strips in place. These skin closures may need to be in place for 2 weeks or longer. If adhesive strip edges start to loosen and curl up, you may trim the loose edges. Do not remove adhesive strips completely unless your health care provider tells you to do that. °· Do not take baths, swim, or use a hot tub until your health care provider approves. Ask your health care provider if you can take showers. You may only be allowed to take sponge baths for bathing. °General Instructions °· Take over-the-counter and prescription medicines only as told by your health care provider. °· Do not drive or operate heavy machinery while taking prescription pain medicine. °· Return to your normal diet as told by your health care  provider. °· Do not lift anything that is heavier than 10 lb (4.5 kg). °· Do not play contact sports for one week or until your health care provider approves. °SEEK MEDICAL CARE IF:  °· You have redness, swelling, or pain at the site of your incision. °· You have fluid, blood, or pus coming from your incision. °· You notice a bad smell coming from your incision area. °· Your surgical incisions break open. °· You have a fever. °SEEK IMMEDIATE MEDICAL CARE IF: °· You develop a rash. °· You have difficulty breathing. °· You have chest pain. °· You have increasing pain in your shoulders (shoulder strap areas). °· You faint or have dizzy episodes while you are standing. °· You have severe pain in your abdomen. °· You have nausea or vomiting that lasts for more than one day. °  °This information is not intended to replace advice given to you by your health care provider. Make sure you discuss any questions you have with your health care provider. °  °Document Released: 10/02/2005 Document Revised: 06/23/2015 Document Reviewed: 05/14/2013 °Elsevier Interactive Patient Education ©2016 [employer] ° °

## 2016-01-06 ENCOUNTER — Telehealth (INDEPENDENT_AMBULATORY_CARE_PROVIDER_SITE_OTHER): Payer: Self-pay | Admitting: *Deleted

## 2016-01-06 DIAGNOSIS — R748 Abnormal levels of other serum enzymes: Secondary | ICD-10-CM

## 2016-01-06 NOTE — Telephone Encounter (Signed)
Per Dr.Rehman the patient will need to have labs drawn in 1 month. 

## 2016-01-07 ENCOUNTER — Emergency Department (HOSPITAL_COMMUNITY)
Admission: EM | Admit: 2016-01-07 | Discharge: 2016-01-08 | Disposition: A | Discharge status: Home / Self Care | Payer: MEDICAID | Attending: Emergency Medicine | Admitting: Emergency Medicine

## 2016-01-07 ENCOUNTER — Encounter (HOSPITAL_COMMUNITY): Payer: Self-pay | Admitting: Emergency Medicine

## 2016-01-07 DIAGNOSIS — Y998 Other external cause status: Secondary | ICD-10-CM | POA: Insufficient documentation

## 2016-01-07 DIAGNOSIS — Y9289 Other specified places as the place of occurrence of the external cause: Secondary | ICD-10-CM | POA: Insufficient documentation

## 2016-01-07 DIAGNOSIS — G8918 Other acute postprocedural pain: Secondary | ICD-10-CM

## 2016-01-07 DIAGNOSIS — J45909 Unspecified asthma, uncomplicated: Secondary | ICD-10-CM | POA: Insufficient documentation

## 2016-01-07 DIAGNOSIS — X58XXXA Exposure to other specified factors, initial encounter: Secondary | ICD-10-CM | POA: Insufficient documentation

## 2016-01-07 DIAGNOSIS — Z9049 Acquired absence of other specified parts of digestive tract: Secondary | ICD-10-CM | POA: Insufficient documentation

## 2016-01-07 DIAGNOSIS — S40211A Abrasion of right shoulder, initial encounter: Secondary | ICD-10-CM | POA: Insufficient documentation

## 2016-01-07 DIAGNOSIS — R509 Fever, unspecified: Secondary | ICD-10-CM | POA: Insufficient documentation

## 2016-01-07 DIAGNOSIS — R1110 Vomiting, unspecified: Secondary | ICD-10-CM | POA: Insufficient documentation

## 2016-01-07 DIAGNOSIS — Y9389 Activity, other specified: Secondary | ICD-10-CM | POA: Insufficient documentation

## 2016-01-07 DIAGNOSIS — Z87891 Personal history of nicotine dependence: Secondary | ICD-10-CM | POA: Insufficient documentation

## 2016-01-07 DIAGNOSIS — Z8659 Personal history of other mental and behavioral disorders: Secondary | ICD-10-CM | POA: Insufficient documentation

## 2016-01-07 LAB — CBC
HCT: 44.1 % (ref 36.0–46.0)
Hemoglobin: 14.5 g/dL (ref 12.0–15.0)
MCH: 28.3 pg (ref 26.0–34.0)
MCHC: 32.9 g/dL (ref 30.0–36.0)
MCV: 86.1 fL (ref 78.0–100.0)
PLATELETS: 377 10*3/uL (ref 150–400)
RBC: 5.12 MIL/uL — ABNORMAL HIGH (ref 3.87–5.11)
RDW: 13.2 % (ref 11.5–15.5)
WBC: 13.6 10*3/uL — AB (ref 4.0–10.5)

## 2016-01-07 MED ORDER — ONDANSETRON HCL 4 MG/2ML IJ SOLN
4.0000 mg | Freq: Once | INTRAMUSCULAR | Status: AC
  Administered 2016-01-08: 4 mg via INTRAVENOUS
  Filled 2016-01-07: qty 2

## 2016-01-07 MED ORDER — HYDROMORPHONE HCL 1 MG/ML IJ SOLN
1.0000 mg | Freq: Once | INTRAMUSCULAR | Status: AC
  Administered 2016-01-08: 1 mg via INTRAVENOUS
  Filled 2016-01-07: qty 1

## 2016-01-07 MED ORDER — SODIUM CHLORIDE 0.9 % IV BOLUS (SEPSIS)
1000.0000 mL | Freq: Once | INTRAVENOUS | Status: AC
  Administered 2016-01-08: 1000 mL via INTRAVENOUS

## 2016-01-07 MED ORDER — KETOROLAC TROMETHAMINE 15 MG/ML IJ SOLN
15.0000 mg | Freq: Once | INTRAMUSCULAR | Status: AC
  Administered 2016-01-08: 15 mg via INTRAVENOUS
  Filled 2016-01-07: qty 1

## 2016-01-07 MED ORDER — LORAZEPAM 2 MG/ML IJ SOLN
1.0000 mg | Freq: Once | INTRAMUSCULAR | Status: AC
  Administered 2016-01-08: 1 mg via INTRAVENOUS
  Filled 2016-01-07: qty 1

## 2016-01-07 NOTE — ED Notes (Addendum)
Pt states she had her gallbladder removed last wed at [facility]. Pt states she was scheduled to have her follow up appointment, but was unable to make the appointment. Pt states she has had a fever on and off since then, but has a temperature of 98.1 in triage. Pt states she has pain where her gallbladder was that is throbbing and dull that she rates at an 8/10. Pt states she has on and off nausea and last episode of emesis was last night. Pt denies she has pain right now, but no nausea. Area around pt's staples are red, but there is no drainage and the area are approximated.  Pt also has she states might have been a bug bite on her right arm. Pt would like this evaluated. Pt states she also has had dirrahea since her surgery
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
## 2016-01-08 LAB — COMPREHENSIVE METABOLIC PANEL
ALBUMIN: 4.5 g/dL (ref 3.5–5.0)
ALK PHOS: 82 U/L (ref 38–126)
ALT: 67 U/L — AB (ref 14–54)
AST: 32 U/L (ref 15–41)
Anion gap: 10 (ref 5–15)
BUN: 12 mg/dL (ref 6–20)
CALCIUM: 9.8 mg/dL (ref 8.9–10.3)
CHLORIDE: 109 mmol/L (ref 101–111)
CO2: 23 mmol/L (ref 22–32)
CREATININE: 1.09 mg/dL — AB (ref 0.44–1.00)
GFR calc Af Amer: >60 mL/min (ref >60)
GFR calc non Af Amer: >60 mL/min (ref >60)
GLUCOSE: 114 mg/dL — AB (ref 65–99)
Potassium: 4 mmol/L (ref 3.5–5.1)
SODIUM: 142 mmol/L (ref 135–145)
Total Bilirubin: 1 mg/dL (ref 0.3–1.2)
Total Protein: 8.2 g/dL — ABNORMAL HIGH (ref 6.5–8.1)

## 2016-01-08 LAB — LIPASE, BLOOD: LIPASE: 37 U/L (ref 11–51)

## 2016-01-08 LAB — URINALYSIS, ROUTINE W REFLEX MICROSCOPIC
GLUCOSE, UA: NEGATIVE mg/dL
Ketones, ur: NEGATIVE mg/dL
Leukocytes, UA: NEGATIVE
NITRITE: NEGATIVE
PH: 5.5 (ref 5.0–8.0)
Protein, ur: NEGATIVE mg/dL
SPECIFIC GRAVITY, URINE: 1.027 (ref 1.005–1.030)

## 2016-01-08 LAB — URINE MICROSCOPIC-ADD ON

## 2016-01-08 MED ORDER — POLYETHYLENE GLYCOL 3350 17 G PO PACK: 17.0000 g | PACK | Freq: Two times a day (BID) | ORAL | Status: DC | PRN

## 2016-01-08 MED ORDER — OXYCODONE-ACETAMINOPHEN 5-325 MG PO TABS: 1.0000 | ORAL_TABLET | ORAL | Status: DC | PRN

## 2016-01-08 NOTE — ED Notes (Signed)
Pt reports understanding of discharge information. No questions at time of discharge 

## 2016-01-12 ENCOUNTER — Encounter (INDEPENDENT_AMBULATORY_CARE_PROVIDER_SITE_OTHER): Payer: Self-pay | Admitting: *Deleted

## 2016-01-12 ENCOUNTER — Other Ambulatory Visit (INDEPENDENT_AMBULATORY_CARE_PROVIDER_SITE_OTHER): Payer: Self-pay | Admitting: *Deleted

## 2016-01-12 DIAGNOSIS — R748 Abnormal levels of other serum enzymes: Secondary | ICD-10-CM

## 2016-02-14 ENCOUNTER — Encounter (HOSPITAL_COMMUNITY): Payer: Self-pay | Admitting: General Surgery

## 2016-06-11 ENCOUNTER — Emergency Department (HOSPITAL_COMMUNITY)
Admission: EM | Admit: 2016-06-11 | Discharge: 2016-06-12 | Disposition: A | Discharge status: Home / Self Care | Payer: Self-pay | Attending: Emergency Medicine | Admitting: Emergency Medicine

## 2016-06-11 ENCOUNTER — Emergency Department (HOSPITAL_COMMUNITY): Payer: Self-pay

## 2016-06-11 ENCOUNTER — Encounter (HOSPITAL_COMMUNITY): Payer: Self-pay | Admitting: *Deleted

## 2016-06-11 DIAGNOSIS — F419 Anxiety disorder, unspecified: Secondary | ICD-10-CM | POA: Insufficient documentation

## 2016-06-11 DIAGNOSIS — J45909 Unspecified asthma, uncomplicated: Secondary | ICD-10-CM | POA: Insufficient documentation

## 2016-06-11 DIAGNOSIS — Z87891 Personal history of nicotine dependence: Secondary | ICD-10-CM | POA: Insufficient documentation

## 2016-06-11 DIAGNOSIS — Z79899 Other long term (current) drug therapy: Secondary | ICD-10-CM | POA: Insufficient documentation

## 2016-06-11 DIAGNOSIS — R0789 Other chest pain: Secondary | ICD-10-CM | POA: Insufficient documentation

## 2016-06-11 LAB — CBC WITH DIFFERENTIAL/PLATELET
BASOS ABS: 0 10*3/uL (ref 0.0–0.1)
Basophils Relative: 0 %
EOS PCT: 1 %
Eosinophils Absolute: 0.2 10*3/uL (ref 0.0–0.7)
HCT: 42.7 % (ref 36.0–46.0)
HEMOGLOBIN: 14 g/dL (ref 12.0–15.0)
LYMPHS PCT: 23 %
Lymphs Abs: 2.8 10*3/uL (ref 0.7–4.0)
MCH: 28.6 pg (ref 26.0–34.0)
MCHC: 32.8 g/dL (ref 30.0–36.0)
MCV: 87.3 fL (ref 78.0–100.0)
Monocytes Absolute: 0.7 10*3/uL (ref 0.1–1.0)
Monocytes Relative: 6 %
NEUTROS ABS: 8.2 10*3/uL — AB (ref 1.7–7.7)
NEUTROS PCT: 70 %
PLATELETS: 289 10*3/uL (ref 150–400)
RBC: 4.89 MIL/uL (ref 3.87–5.11)
RDW: 13 % (ref 11.5–15.5)
WBC: 11.9 10*3/uL — AB (ref 4.0–10.5)

## 2016-06-11 LAB — COMPREHENSIVE METABOLIC PANEL
ALT: 23 U/L (ref 14–54)
AST: 20 U/L (ref 15–41)
Albumin: 4 g/dL (ref 3.5–5.0)
Alkaline Phosphatase: 68 U/L (ref 38–126)
Anion gap: 8 (ref 5–15)
BUN: 10 mg/dL (ref 6–20)
CHLORIDE: 104 mmol/L (ref 101–111)
CO2: 21 mmol/L — AB (ref 22–32)
CREATININE: 0.82 mg/dL (ref 0.44–1.00)
Calcium: 8.7 mg/dL — ABNORMAL LOW (ref 8.9–10.3)
GFR calc Af Amer: >60 mL/min (ref >60)
Glucose, Bld: 109 mg/dL — ABNORMAL HIGH (ref 65–99)
Potassium: 3.7 mmol/L (ref 3.5–5.1)
Sodium: 133 mmol/L — ABNORMAL LOW (ref 135–145)
Total Bilirubin: 0.6 mg/dL (ref 0.3–1.2)
Total Protein: 6.9 g/dL (ref 6.5–8.1)

## 2016-06-11 LAB — URINALYSIS, ROUTINE W REFLEX MICROSCOPIC
Bilirubin Urine: NEGATIVE
Glucose, UA: NEGATIVE mg/dL
Hgb urine dipstick: NEGATIVE
Ketones, ur: NEGATIVE mg/dL
Leukocytes, UA: NEGATIVE
NITRITE: NEGATIVE
Protein, ur: NEGATIVE mg/dL
SPECIFIC GRAVITY, URINE: 1.014 (ref 1.005–1.030)
pH: 7 (ref 5.0–8.0)

## 2016-06-11 LAB — I-STAT TROPONIN, ED: TROPONIN I, POC: 0 ng/mL (ref 0.00–0.08)

## 2016-06-11 LAB — I-STAT BETA HCG BLOOD, ED (MC, WL, AP ONLY): I-stat hCG, quantitative: <5 m[IU]/mL (ref <5)

## 2016-06-11 MED ORDER — IBUPROFEN 800 MG PO TABS
800.0000 mg | ORAL_TABLET | Freq: Once | ORAL | Status: AC
  Administered 2016-06-12: 800 mg via ORAL
  Filled 2016-06-11: qty 1

## 2016-06-11 MED ORDER — LORAZEPAM 1 MG PO TABS
1.0000 mg | ORAL_TABLET | Freq: Once | ORAL | Status: AC
Start: 2016-06-12 — End: 2016-06-12
  Administered 2016-06-12: 1 mg via ORAL
  Filled 2016-06-11: qty 1
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
## 2016-06-12 LAB — D-DIMER, QUANTITATIVE: D-Dimer, Quant: <0.27 ug/mL-FEU (ref 0.00–0.50)

## 2016-06-12 MED ORDER — HYDROXYZINE HCL 25 MG PO TABS: 25.0000 mg | ORAL_TABLET | Freq: Four times a day (QID) | ORAL | 0 refills | Status: DC | PRN

## 2016-06-13 ENCOUNTER — Emergency Department (HOSPITAL_COMMUNITY)
Admission: EM | Admit: 2016-06-13 | Discharge: 2016-06-14 | Disposition: A | Discharge status: Home / Self Care | Payer: Self-pay | Attending: Emergency Medicine | Admitting: Emergency Medicine

## 2016-06-13 ENCOUNTER — Encounter (HOSPITAL_COMMUNITY): Payer: Self-pay | Admitting: Emergency Medicine

## 2016-06-13 DIAGNOSIS — Z79899 Other long term (current) drug therapy: Secondary | ICD-10-CM | POA: Insufficient documentation

## 2016-06-13 DIAGNOSIS — F419 Anxiety disorder, unspecified: Secondary | ICD-10-CM | POA: Insufficient documentation

## 2016-06-13 DIAGNOSIS — R110 Nausea: Secondary | ICD-10-CM | POA: Insufficient documentation

## 2016-06-13 DIAGNOSIS — F410 Panic disorder [episodic paroxysmal anxiety] without agoraphobia: Secondary | ICD-10-CM

## 2016-06-13 DIAGNOSIS — Z87891 Personal history of nicotine dependence: Secondary | ICD-10-CM | POA: Insufficient documentation

## 2016-06-13 DIAGNOSIS — J45909 Unspecified asthma, uncomplicated: Secondary | ICD-10-CM | POA: Insufficient documentation

## 2016-06-13 MED ORDER — PROCHLORPERAZINE EDISYLATE 5 MG/ML IJ SOLN
5.0000 mg | Freq: Once | INTRAMUSCULAR | Status: AC
  Administered 2016-06-13: 5 mg via INTRAVENOUS
  Filled 2016-06-13: qty 2

## 2016-06-13 MED ORDER — LORAZEPAM 2 MG/ML IJ SOLN
0.5000 mg | Freq: Once | INTRAMUSCULAR | Status: AC
  Administered 2016-06-13: 0.5 mg via INTRAVENOUS
  Filled 2016-06-13: qty 1

## 2016-06-13 MED ORDER — FAMOTIDINE 20 MG PO TABS
20.0000 mg | ORAL_TABLET | Freq: Once | ORAL | Status: AC
  Administered 2016-06-13: 20 mg via ORAL
  Filled 2016-06-13: qty 1
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
## 2016-06-13 NOTE — ED Triage Notes (Signed)
Pt c/o reoccurring chest pain x one week. Pt states she has been seen for the same at area hospitals.

## 2016-06-14 MED ORDER — RANITIDINE HCL 150 MG PO TABS: 150.0000 mg | ORAL_TABLET | Freq: Two times a day (BID) | ORAL | 0 refills | Status: DC

## 2016-06-14 MED ORDER — LORAZEPAM 0.5 MG PO TABS: 0.5000 mg | ORAL_TABLET | Freq: Three times a day (TID) | ORAL | 0 refills | Status: DC | PRN

## 2016-06-14 NOTE — Discharge Instructions (Signed)
Please monitor your diet for foods that cause increase indigestion and problems with your esophagus. Use Zantac 150 or pharmacy equivalent 2 times daily. Use Ativan 3 times daily to keep anxiety under control. Please see your primary physician, or the [facility] physicians concerning your anxiety.

## 2016-07-02 ENCOUNTER — Emergency Department (HOSPITAL_COMMUNITY)
Admission: EM | Admit: 2016-07-02 | Discharge: 2016-07-02 | Disposition: A | Discharge status: Home / Self Care | Payer: Self-pay | Attending: Emergency Medicine | Admitting: Emergency Medicine

## 2016-07-02 ENCOUNTER — Encounter (HOSPITAL_COMMUNITY): Payer: Self-pay | Admitting: Emergency Medicine

## 2016-07-02 ENCOUNTER — Emergency Department (HOSPITAL_COMMUNITY): Payer: Self-pay

## 2016-07-02 DIAGNOSIS — Z87891 Personal history of nicotine dependence: Secondary | ICD-10-CM | POA: Insufficient documentation

## 2016-07-02 DIAGNOSIS — Z79899 Other long term (current) drug therapy: Secondary | ICD-10-CM | POA: Insufficient documentation

## 2016-07-02 DIAGNOSIS — R1013 Epigastric pain: Secondary | ICD-10-CM

## 2016-07-02 DIAGNOSIS — R0602 Shortness of breath: Secondary | ICD-10-CM | POA: Insufficient documentation

## 2016-07-02 DIAGNOSIS — J45909 Unspecified asthma, uncomplicated: Secondary | ICD-10-CM | POA: Insufficient documentation

## 2016-07-02 LAB — CBC
HCT: 41.5 % (ref 36.0–46.0)
Hemoglobin: 13.6 g/dL (ref 12.0–15.0)
MCH: 28.9 pg (ref 26.0–34.0)
MCHC: 32.8 g/dL (ref 30.0–36.0)
MCV: 88.1 fL (ref 78.0–100.0)
PLATELETS: 309 10*3/uL (ref 150–400)
RBC: 4.71 MIL/uL (ref 3.87–5.11)
RDW: 13.5 % (ref 11.5–15.5)
WBC: 10.7 10*3/uL — ABNORMAL HIGH (ref 4.0–10.5)

## 2016-07-02 LAB — I-STAT TROPONIN, ED: TROPONIN I, POC: 0 ng/mL (ref 0.00–0.08)

## 2016-07-02 LAB — BASIC METABOLIC PANEL
Anion gap: 6 (ref 5–15)
BUN: 8 mg/dL (ref 6–20)
CALCIUM: 8.7 mg/dL — AB (ref 8.9–10.3)
CO2: 23 mmol/L (ref 22–32)
CREATININE: 0.77 mg/dL (ref 0.44–1.00)
Chloride: 111 mmol/L (ref 101–111)
GFR calc Af Amer: >60 mL/min (ref >60)
GFR calc non Af Amer: >60 mL/min (ref >60)
GLUCOSE: 105 mg/dL — AB (ref 65–99)
Potassium: 3.8 mmol/L (ref 3.5–5.1)
Sodium: 140 mmol/L (ref 135–145)

## 2016-07-02 LAB — HEPATIC FUNCTION PANEL
ALT: 17 U/L (ref 14–54)
AST: 19 U/L (ref 15–41)
Albumin: 3.9 g/dL (ref 3.5–5.0)
Alkaline Phosphatase: 64 U/L (ref 38–126)
BILIRUBIN DIRECT: 0.1 mg/dL (ref 0.1–0.5)
BILIRUBIN TOTAL: 0.4 mg/dL (ref 0.3–1.2)
Indirect Bilirubin: 0.3 mg/dL (ref 0.3–0.9)
Total Protein: 7.2 g/dL (ref 6.5–8.1)

## 2016-07-02 MED ORDER — SUCRALFATE 1 G PO TABS: 1.0000 g | ORAL_TABLET | Freq: Three times a day (TID) | ORAL | 0 refills | Status: DC

## 2016-07-02 MED ORDER — GI COCKTAIL ~~LOC~~
30.0000 mL | Freq: Once | ORAL | Status: AC
  Administered 2016-07-02: 30 mL via ORAL
  Filled 2016-07-02: qty 30

## 2016-07-02 MED ORDER — LORAZEPAM 0.5 MG PO TABS: 0.5000 mg | ORAL_TABLET | Freq: Three times a day (TID) | ORAL | 0 refills | Status: DC | PRN

## 2016-07-02 MED ORDER — LORAZEPAM 1 MG PO TABS: 0.5000 mg | ORAL_TABLET | Freq: Three times a day (TID) | ORAL | 0 refills | Status: DC | PRN

## 2016-07-02 NOTE — Discharge Instructions (Signed)
Take carafate as directed - I have provided you a coupon for this medication which will only work if you get it filled at [facility]. It should be around $30.  Please start taking the zantac prescription you were given as well.  Keep your scheduled appointment with the GI doctor.  Return to ER for new or worsening symptoms, any additional concerns.

## 2016-07-02 NOTE — ED Notes (Signed)
PT DISCHARGED. INSTRUCTIONS AND PRESCRIPTIONS GIVEN. AAOX4. PT IN NO APPARENT DISTRESS OR PAIN. THE OPPORTUNITY TO ASK QUESTIONS WAS PROVIDED. 

## 2016-07-02 NOTE — ED Provider Notes (Signed)
[facility]-EMERGENCY DEPT Provider Note   CSN: [id number] Arrival date & time: 07/02/16  1326     History   Chief Complaint Chief Complaint  Patient presents with  . Chest Pain    HPI Hannah Lambert is a 30 y.o. female.  The history is provided by the patient and medical records. No language interpreter was used.    Hannah Lambert is a 30 y.o. female  who presents to the Emergency Department complaining of central chest tightness but intermittently radiates to left arm that began today while eating. She has a history of anxiety states the feeling that really scared her, causing her to feel like she was having an anxiety attack with shortness of breath. She states that she had her gallbladder out in March 2017 and has been experiencing these symptoms ever since then. Over the last 2 or 3 months and has become much worse prompting her to seek evaluation in the ER. Patient was seen on 8/27 and subsequently on 8/29 for the same where she had a very thorough workup and was told symptoms were likely secondary to anxiety. Patient states that symptoms only occur while or shortly after eating. She went on a fast where she ate only yogurt and nuts for 4-5 days and did not experience any symptoms. She endorses that the less she eats, the less the pain occurs and vice versa. She was given a prescription for Zantac at last ED visit but did not start taking this medication. She endorses persistent epigastric and right upper quadrant pain that also worsens with eating. She denies diaphoresis, nausea or vomiting, back pain. She has a follow up appointment with her gastroenterologist in December.   Past Medical History:  Diagnosis Date  . Anxiety   . Asthma    as a child  . Depression   . Family history of adverse reaction to anesthesia    post op nausea    Patient Active Problem List   Diagnosis Date Noted  . Malnutrition of moderate degree 12/25/2015  . Acute cholecystitis 12/24/2015  .  Leukocytosis 12/24/2015  . Transaminitis 12/24/2015  . Depression with anxiety 12/24/2015  . Gallstones 12/24/2015  . Cholecystitis 12/24/2015  . Overweight 12/21/2014    Past Surgical History:  Procedure Laterality Date  . CHOLECYSTECTOMY N/A 12/29/2015   Procedure: LAPAROSCOPIC CHOLECYSTECTOMY WITH INTRAOPERATIVE CHOLANGIOGRAM;  Surgeon: Franky MachoMark Jenkins, MD;  Location: [facility] ORS;  Service: General;  Laterality: N/A;  . DIAGNOSTIC LAPAROSCOPIC LIVER BIOPSY  12/29/2015   Procedure: LAPAROSCOPIC LIVER BIOPSY;  Surgeon: Franky MachoMark Jenkins, MD;  Location: [facility] OR[facility];  Service: General;;    OB History    Gravida Para Term Preterm AB Living   0 0 0 0 0 0   SAB TAB Ectopic Multiple Live Births   0 0 0 0         Home Medications    Prior to Admission medications   Medication Sig Start Date End Date Taking? Authorizing Provider  clonazePAM (KLONOPIN) 1 MG tablet Take 1 mg by mouth 2 (two) times daily as needed for anxiety.   Yes Historical Provider, MD  FLUoxetine (PROZAC) 20 MG tablet Take 20 mg by mouth daily.   Yes Historical Provider, MD  Multiple Vitamin (MULTIVITAMIN WITH MINERALS) TABS tablet Take 1 tablet by mouth daily.   Yes Historical Provider, MD  Vitamin D, Ergocalciferol, (DRISDOL) 50000 units CAPS capsule Take 50,000 Units by mouth every 7 (seven) days.   Yes Historical Provider, MD  hydrOXYzine (ATARAX/VISTARIL) 25  MG tablet Take 1 tablet (25 mg total) by mouth every 6 (six) hours as needed for anxiety. 06/12/16   Tilden Fossa, MD  LORazepam (ATIVAN) 0.5 MG tablet Take 1 tablet (0.5 mg total) by mouth 3 (three) times daily as needed for anxiety. 07/02/16   Jaime Pilcher Ward, PA-C  ondansetron (ZOFRAN ODT) 4 MG disintegrating tablet Take 1 tablet (4 mg total) by mouth every 8 (eight) hours as needed for nausea or vomiting. Patient not taking: Reported on 12/19/2015 12/21/14   Pearline Cables, MD  oxyCODONE (OXY IR/ROXICODONE) 5 MG immediate release tablet Take 1 tablet (5 mg total) by mouth  every 4 (four) hours as needed for moderate pain. Patient not taking: Reported on 06/12/2016 12/31/15   Franky Macho, MD  oxyCODONE-acetaminophen (PERCOCET/ROXICET) 5-325 MG tablet Take 1-2 tablets by mouth every 4 (four) hours as needed for severe pain. Patient not taking: Reported on 06/12/2016 01/08/16   Raeford Razor, MD  polyethylene glycol [facility] Ethelene Hal) packet Take 17 g by mouth 2 (two) times daily as needed. Patient not taking: Reported on 06/12/2016 01/08/16   Raeford Razor, MD  promethazine (PHENERGAN) 25 MG tablet Take 1 tablet (25 mg total) by mouth every 6 (six) hours as needed for nausea or vomiting. Patient not taking: Reported on 01/07/2016 12/19/15   Bethann Berkshire, MD  ranitidine (ZANTAC) 150 MG tablet Take 1 tablet (150 mg total) by mouth 2 (two) times daily. Patient not taking: Reported on 07/02/2016 06/14/16   Ivery Quale, PA-C  sucralfate (CARAFATE) 1 g tablet Take 1 tablet (1 g total) by mouth 4 (four) times daily -  with meals and at bedtime. 07/02/16   Chase Picket Ward, PA-C  traMADol (ULTRAM) 50 MG tablet Take 1 tablet (50 mg total) by mouth every 6 (six) hours as needed. Patient not taking: Reported on 01/07/2016 12/19/15   Bethann Berkshire, MD    Family History Family History  Problem Relation Age of Onset  . Hypertension Mother   . Hypertension Father   . Hypertension Maternal Grandmother   . Mental illness Maternal Grandmother   . Mental illness Paternal Grandmother     Social History Social History  Substance Use Topics  . Smoking status: Former Smoker    Types: Cigarettes    Quit date: 07/17/2015  . Smokeless tobacco: Never Used     Comment: 1/4 ppd  . Alcohol use 0.0 oz/week     Comment: occasional     Allergies   Review of patient's allergies indicates no known allergies.   Review of Systems Review of Systems  Constitutional: Negative for chills and fever.  HENT: Negative for congestion.   Eyes: Negative for visual disturbance.  Respiratory:  Positive for shortness of breath. Negative for cough.   Cardiovascular: Positive for chest pain. Negative for palpitations and leg swelling.  Gastrointestinal: Positive for abdominal pain. Negative for nausea and vomiting.  Genitourinary: Negative for dysuria.  Musculoskeletal: Negative for back pain and neck pain.  Skin: Negative for rash.  Neurological: Negative for headaches.     Physical Exam Updated Vital Signs BP 108/79 (BP Location: Left Arm)   Pulse 76   Temp 98.3 F (36.8 C) (Oral)   Resp 19   Ht 5\' 7"  (1.702 m)   Wt 117.9 kg   LMP 06/30/2016   SpO2 97%   BMI 40.72 kg/m   Physical Exam  Constitutional: She is oriented to person, place, and time. She appears well-developed and well-nourished. No distress.  HENT:  Head: Normocephalic and atraumatic.  Cardiovascular: Normal rate, regular rhythm, normal heart sounds and intact distal pulses.   No murmur heard. Pulmonary/Chest: Effort normal and breath sounds normal. No respiratory distress. She has no wheezes. She has no rales. She exhibits tenderness.  Abdominal: Soft. Bowel sounds are normal. She exhibits no distension. There is tenderness (Epigastric and RUQ).  Musculoskeletal: Normal range of motion. She exhibits no edema.  Neurological: She is alert and oriented to person, place, and time.  Skin: Skin is warm and dry.  Nursing note and vitals reviewed.    ED Treatments / Results  Labs (all labs ordered are listed, but only abnormal results are displayed) Labs Reviewed  BASIC METABOLIC PANEL - Abnormal; Notable for the following:       Result Value   Glucose, Bld 105 (*)    Calcium 8.7 (*)    All other components within normal limits  CBC - Abnormal; Notable for the following:    WBC 10.7 (*)    All other components within normal limits  HEPATIC FUNCTION PANEL  I-STAT TROPOININ, ED    EKG  EKG Interpretation  Date/Time:  Sunday July 02 2016 13:34:05 EDT Ventricular Rate:  99 PR Interval:      QRS Duration: 73 QT Interval:  327 QTC Calculation: 420 R Axis:   34 Text Interpretation:  Sinus tachycardia No STEMI.  Similar to prior tracing Confirmed by LONG MD, JOSHUA [phone]) on 07/02/2016 1:36:54 PM       Radiology Dg Chest 2 View  Result Date: 07/02/2016 CLINICAL DATA:  Pt reports that she was eating lunch today and stared to feel a tightness in her chest w/ pain that radiated to her left arm and rib cage. Pt took a clonopin. Hx of smoking and asthma. EXAM: CHEST  2 VIEW COMPARISON:  06/11/2016 FINDINGS: Normal mediastinum and cardiac silhouette. Normal pulmonary vasculature. No evidence of effusion, infiltrate, or pneumothorax. No acute bony abnormality. IMPRESSION: No acute cardiopulmonary process. Electronically Signed   By: Genevive Bi M.D.   On: 07/02/2016 14:11   US Abdomen Limited Ruq  Result Date: 07/02/2016 CLINICAL DATA:  Post cholecystectomy March 2017, epigastric pain EXAM: US ABDOMEN LIMITED - RIGHT UPPER QUADRANT COMPARISON:  12/28/2015 FINDINGS: Gallbladder: Surgically absent Common bile duct: Diameter: 4 mm diameter, normal Liver: Echogenic, likely fatty infiltration though this can be seen with cirrhosis and certain infiltrative disorders. No gross evidence of hepatic mass or nodularity identified though assessment of intrahepatic detail is limited by sound attenuation. Hepatopetal portal venous flow. No RIGHT upper quadrant free fluid. IMPRESSION: Post cholecystectomy without biliary dilatation. Probable fatty infiltration of liver as above with suboptimal assessment of intrahepatic detail due to sound attenuation. Electronically Signed   By: Ulyses Southward M.D.   On: 07/02/2016 15:36    Procedures Procedures (including critical care time)  Medications Ordered in ED Medications  gi cocktail (Maalox,Lidocaine,Donnatal) (30 mLs Oral Given 07/02/16 1540)     Initial Impression / Assessment and Plan / ED Course  I have reviewed the triage vital signs and the  nursing notes.  Pertinent labs & imaging results that were available during my care of the patient were reviewed by me and considered in my medical decision making (see chart for details).  Clinical Course   Hannah Lambert is a 30 y.o. female who presents to ED for chest tightness that began today. Hx of the same for the last 2-3 months and pain only occurs while or shortly after eating. She  does exhibit tenderness to epigastrium and RUQ along with chest wall. She was been seen in ED twice this month with negative cardiac workup. CXR, EKG and troponin reassuring today. Unlikely cardiopulmonary etiology. RUQ ultrasound was performed with probably fatty infiltrate of liver but otherwise benign. Labs reviewed: normal hepatic function panel. She is afebrile, hemodynamically stable and very well appearing. She was given rx for zantac at last ED visit but did not start taking this medication. Encouraged her to take this as directed and will give rx for carafate as well. She has a GI doctor and has follow up in December. Encouraged her to see if she can see specialist sooner than December, especially if symptoms persist. She was given short course of ativan at prior ED visit and states this seemed to help symptoms. Anxiety likely contributing to some extent as well. Three day course given and patient encouraged to follow up with [facility] who follows anxiety. Reasons to return to ED discussed and all questions answered.   Patient discussed with Dr. Fayrene Fearing who agrees with treatment plan.    Final Clinical Impressions(s) / ED Diagnoses   Final diagnoses:  Epigastric pain    New Prescriptions Discharge Medication List as of 07/02/2016  4:35 PM    START taking these medications   Details  sucralfate (CARAFATE) 1 g tablet Take 1 tablet (1 g total) by mouth 4 (four) times daily -  with meals and at bedtime., Starting Sun 07/02/2016, Print         [facility] Ward, PA-C 07/02/16 1858    Rolland Porter,  MD 07/04/16 [phone]

## 2016-07-02 NOTE — ED Notes (Signed)
Ultrasound at bedside

## 2016-07-02 NOTE — ED Triage Notes (Signed)
Pt reports that she was eating lunch today and stared to feel a tightness in her chest w/ pain that radiated to her left arm and rib cage. Pt took a clonopin.

## 2016-07-18 ENCOUNTER — Emergency Department (HOSPITAL_COMMUNITY)
Admission: EM | Admit: 2016-07-18 | Discharge: 2016-07-18 | Disposition: A | Discharge status: Home / Self Care | Payer: Self-pay | Attending: Emergency Medicine | Admitting: Emergency Medicine

## 2016-07-18 ENCOUNTER — Emergency Department (HOSPITAL_COMMUNITY): Payer: Self-pay

## 2016-07-18 ENCOUNTER — Encounter (HOSPITAL_COMMUNITY): Payer: Self-pay | Admitting: *Deleted

## 2016-07-18 DIAGNOSIS — J45909 Unspecified asthma, uncomplicated: Secondary | ICD-10-CM | POA: Insufficient documentation

## 2016-07-18 DIAGNOSIS — R112 Nausea with vomiting, unspecified: Secondary | ICD-10-CM | POA: Insufficient documentation

## 2016-07-18 DIAGNOSIS — Z87891 Personal history of nicotine dependence: Secondary | ICD-10-CM | POA: Insufficient documentation

## 2016-07-18 DIAGNOSIS — R1010 Upper abdominal pain, unspecified: Secondary | ICD-10-CM

## 2016-07-18 DIAGNOSIS — R1013 Epigastric pain: Secondary | ICD-10-CM | POA: Insufficient documentation

## 2016-07-18 DIAGNOSIS — R0789 Other chest pain: Secondary | ICD-10-CM | POA: Insufficient documentation

## 2016-07-18 DIAGNOSIS — R197 Diarrhea, unspecified: Secondary | ICD-10-CM | POA: Insufficient documentation

## 2016-07-18 DIAGNOSIS — R1012 Left upper quadrant pain: Secondary | ICD-10-CM | POA: Insufficient documentation

## 2016-07-18 LAB — COMPREHENSIVE METABOLIC PANEL
ALK PHOS: 59 U/L (ref 38–126)
ALT: 30 U/L (ref 14–54)
AST: 21 U/L (ref 15–41)
Albumin: 4.1 g/dL (ref 3.5–5.0)
Anion gap: 6 (ref 5–15)
BUN: 13 mg/dL (ref 6–20)
CALCIUM: 8.8 mg/dL — AB (ref 8.9–10.3)
CHLORIDE: 108 mmol/L (ref 101–111)
CO2: 23 mmol/L (ref 22–32)
CREATININE: 0.74 mg/dL (ref 0.44–1.00)
GFR calc Af Amer: >60 mL/min (ref >60)
Glucose, Bld: 89 mg/dL (ref 65–99)
Potassium: 3.8 mmol/L (ref 3.5–5.1)
Sodium: 137 mmol/L (ref 135–145)
Total Bilirubin: 0.3 mg/dL (ref 0.3–1.2)
Total Protein: 7.1 g/dL (ref 6.5–8.1)

## 2016-07-18 LAB — URINALYSIS, ROUTINE W REFLEX MICROSCOPIC
Bilirubin Urine: NEGATIVE
Glucose, UA: NEGATIVE mg/dL
Hgb urine dipstick: NEGATIVE
Ketones, ur: NEGATIVE mg/dL
NITRITE: NEGATIVE
PH: 7 (ref 5.0–8.0)
SPECIFIC GRAVITY, URINE: 1.02 (ref 1.005–1.030)

## 2016-07-18 LAB — CBC WITH DIFFERENTIAL/PLATELET
Basophils Absolute: 0.1 10*3/uL (ref 0.0–0.1)
Basophils Relative: 0 %
EOS ABS: 0.2 10*3/uL (ref 0.0–0.7)
EOS PCT: 2 %
HCT: 39.8 % (ref 36.0–46.0)
Hemoglobin: 13.3 g/dL (ref 12.0–15.0)
LYMPHS ABS: 4.2 10*3/uL — AB (ref 0.7–4.0)
Lymphocytes Relative: 30 %
MCH: 29 pg (ref 26.0–34.0)
MCHC: 33.4 g/dL (ref 30.0–36.0)
MCV: 86.7 fL (ref 78.0–100.0)
MONO ABS: 0.8 10*3/uL (ref 0.1–1.0)
MONOS PCT: 6 %
Neutro Abs: 8.9 10*3/uL — ABNORMAL HIGH (ref 1.7–7.7)
Neutrophils Relative %: 62 %
PLATELETS: 279 10*3/uL (ref 150–400)
RBC: 4.59 MIL/uL (ref 3.87–5.11)
RDW: 13.3 % (ref 11.5–15.5)
WBC: 14.2 10*3/uL — AB (ref 4.0–10.5)

## 2016-07-18 LAB — LIPASE, BLOOD: LIPASE: 19 U/L (ref 11–51)

## 2016-07-18 LAB — TROPONIN I: Troponin I: <0.03 ng/mL (ref <0.03)

## 2016-07-18 LAB — URINE MICROSCOPIC-ADD ON: RBC / HPF: NONE SEEN RBC/hpf (ref 0–5)

## 2016-07-18 LAB — PREGNANCY, URINE: Preg Test, Ur: NEGATIVE

## 2016-07-18 LAB — D-DIMER, QUANTITATIVE: D-Dimer, Quant: <0.27 ug/mL-FEU (ref 0.00–0.50)

## 2016-07-18 MED ORDER — OMEPRAZOLE 20 MG PO CPDR: 20.0000 mg | DELAYED_RELEASE_CAPSULE | Freq: Every day | ORAL | 0 refills | Status: DC

## 2016-07-18 MED ORDER — GI COCKTAIL ~~LOC~~
30.0000 mL | Freq: Once | ORAL | Status: AC
  Administered 2016-07-18: 30 mL via ORAL
  Filled 2016-07-18: qty 30

## 2016-07-18 MED ORDER — ONDANSETRON 4 MG PO TBDP
4.0000 mg | ORAL_TABLET | Freq: Once | ORAL | Status: AC
  Administered 2016-07-18: 4 mg via ORAL
  Filled 2016-07-18: qty 1

## 2016-07-18 MED ORDER — ONDANSETRON HCL 4 MG PO TABS: 4.0000 mg | ORAL_TABLET | Freq: Four times a day (QID) | ORAL | 0 refills | Status: DC

## 2016-07-18 NOTE — ED Provider Notes (Signed)
[facility]-EMERG[facility]Y DEPT Provider Note   CSN: [id number] Arrival date & time: 07/18/16  0013  By signing my name below, I, Vista Minkobert Ross, attest that this documentation has been prepared under the direction and in the presence of Glynn OctaveStephen Trust Crago, MD. Electronically signed, Vista Minkobert Ross, ED Scribe. 07/18/16. 12:42 AM.  History   Chief Complaint Chief Complaint  Patient presents with  . Chest Pain    HPI HPI Comments: Barbaraann BoysBrooke N Dominy is a 30 y.o. female with a Hx of anxiety, cholecystectomy, who presents to the Emergency Department complaining of constant left sided chest pain that radiates down her left arm and into her back for the past 3 days. [facility] similar complaints and states that this feels the same. Pt states she has had intermittent chest pain for the past 3 months and these episodes have become more frequent. She also reports associated nausea and vomiting, last episode prior to arrival. Pt states that she has been throwing up every thing she eats lately. She reports shortness of breath and a "hot" feeling during these chest pain episodes. No exacerbating or alleviating factors noted. Pt just started taking Prozac last week but does not take any medications regularly.   The history is provided by the patient. No language interpreter was used.    Past Medical History:  Diagnosis Date  . Anxiety   . Asthma    as a child  . Depression   . Family history of adverse reaction to anesthesia    post op nausea    Patient Active Problem List   Diagnosis Date Noted  . Malnutrition of moderate degree 12/25/2015  . Acute cholecystitis 12/24/2015  . Leukocytosis 12/24/2015  . Transaminitis 12/24/2015  . Depression with anxiety 12/24/2015  . Gallstones 12/24/2015  . Cholecystitis 12/24/2015  . Overweight 12/21/2014    Past Surgical History:  Procedure Laterality Date  . CHOLECYSTECTOMY N/A 12/29/2015   Procedure: LAPAROSCOPIC CHOLECYSTECTOMY WITH  INTRAOPERATIVE CHOLANGIOGRAM;  Surgeon: Franky MachoMark Jenkins, MD;  Location: [facility] ORS;  Service: General;  Laterality: N/A;  . DIAGNOSTIC LAPAROSCOPIC LIVER BIOPSY  12/29/2015   Procedure: LAPAROSCOPIC LIVER BIOPSY;  Surgeon: Franky MachoMark Jenkins, MD;  Location: [facility] ORS;  Service: General;;    OB History    Gravida Para Term Preterm AB Living   0 0 0 0 0 0   SAB TAB Ectopic Multiple Live Births   0 0 0 0         Home Medications    Prior to Admission medications   Medication Sig Start Date End Date Taking? Authorizing Provider  clonazePAM (KLONOPIN) 1 MG tablet Take 1 mg by mouth 2 (two) times daily as needed for anxiety.    Historical Provider, MD  FLUoxetine (PROZAC) 20 MG tablet Take 20 mg by mouth daily.    Historical Provider, MD  hydrOXYzine (ATARAX/VISTARIL) 25 MG tablet Take 1 tablet (25 mg total) by mouth every 6 (six) hours as needed for anxiety. 06/12/16   Tilden FossaElizabeth Rees, MD  LORazepam (ATIVAN) 0.5 MG tablet Take 1 tablet (0.5 mg total) by mouth 3 (three) times daily as needed for anxiety. 07/02/16   Chase PicketJaime Pilcher Ward, PA-C  Multiple Vitamin (MULTIVITAMIN WITH MINERALS) TABS tablet Take 1 tablet by mouth daily.    Historical Provider, MD  ondansetron (ZOFRAN ODT) 4 MG disintegrating tablet Take 1 tablet (4 mg total) by mouth every 8 (eight) hours as needed for nausea or vomiting. Patient not taking: Reported on 12/19/2015 12/21/14  Gwenlyn Found Copland, MD  oxyCODONE (OXY IR/ROXICODONE) 5 MG immediate release tablet Take 1 tablet (5 mg total) by mouth every 4 (four) hours as needed for moderate pain. Patient not taking: Reported on 06/12/2016 12/31/15   Franky Macho, MD  oxyCODONE-acetaminophen (PERCOCET/ROXICET) 5-325 MG tablet Take 1-2 tablets by mouth every 4 (four) hours as needed for severe pain. Patient not taking: Reported on 06/12/2016 01/08/16   Raeford Razor, MD  polyethylene glycol [facility] / Ethelene Hal) packet Take 17 g by mouth 2 (two) times daily as needed. Patient not taking: Reported on  06/12/2016 01/08/16   Raeford Razor, MD  promethazine (PHENERGAN) 25 MG tablet Take 1 tablet (25 mg total) by mouth every 6 (six) hours as needed for nausea or vomiting. Patient not taking: Reported on 01/07/2016 12/19/15   Bethann Berkshire, MD  ranitidine (ZANTAC) 150 MG tablet Take 1 tablet (150 mg total) by mouth 2 (two) times daily. Patient not taking: Reported on 07/02/2016 06/14/16   Ivery Quale, PA-C  sucralfate (CARAFATE) 1 g tablet Take 1 tablet (1 g total) by mouth 4 (four) times daily -  with meals and at bedtime. 07/02/16   Chase Picket Ward, PA-C  traMADol (ULTRAM) 50 MG tablet Take 1 tablet (50 mg total) by mouth every 6 (six) hours as needed. Patient not taking: Reported on 01/07/2016 12/19/15   Bethann Berkshire, MD  Vitamin D, Ergocalciferol, (DRISDOL) 50000 units CAPS capsule Take 50,000 Units by mouth every 7 (seven) days.    Historical Provider, MD    Family History Family History  Problem Relation Age of Onset  . Hypertension Mother   . Hypertension Father   . Hypertension Maternal Grandmother   . Mental illness Maternal Grandmother   . Mental illness Paternal Grandmother     Social History Social History  Substance Use Topics  . Smoking status: Former Smoker    Types: Cigarettes    Quit date: 07/17/2015  . Smokeless tobacco: Never Used     Comment: 1/4 ppd  . Alcohol use 0.0 oz/week     Comment: occasional     Allergies   Review of patient's allergies indicates no known allergies.   Review of Systems Review of Systems  Cardiovascular: Positive for chest pain.  Gastrointestinal: Positive for diarrhea, nausea and vomiting.  All other systems reviewed and are negative.    Physical Exam Updated Vital Signs BP (!) 142/102 (BP Location: Left Arm)   Pulse 88   Temp 98.1 F (36.7 C) (Oral)   Resp 20   Ht 5\' 7"  (1.702 m)   Wt 260 lb (117.9 kg)   LMP 06/30/2016   SpO2 98%   BMI 40.72 kg/m   Physical Exam  Constitutional: She is oriented to person, place, and  time. She appears well-developed and well-nourished. No distress.  Obese  HENT:  Head: Normocephalic and atraumatic.  Mouth/Throat: Oropharynx is clear and moist. No oropharyngeal exudate.  Eyes: Conjunctivae and EOM are normal. Pupils are equal, round, and reactive to light.  Neck: Normal range of motion. Neck supple.  No meningismus.  Cardiovascular: Normal rate, regular rhythm, normal heart sounds and intact distal pulses.   No murmur heard. Pulmonary/Chest: Effort normal and breath sounds normal. No respiratory distress.  Abdominal: Soft. There is tenderness. There is no rebound and no guarding.  epigastric and left upper quadrant tenderness  Musculoskeletal: Normal range of motion. She exhibits no edema or tenderness.  Neurological: She is alert and oriented to person, place, and time. No cranial nerve  deficit. She exhibits normal muscle tone. Coordination normal.  No ataxia on finger to nose bilaterally. No pronator drift. 5/5 strength throughout. CN 2-12 intact.Equal grip strength. Sensation intact.   Skin: Skin is warm.  Psychiatric: She has a normal mood and affect. Her behavior is normal.  Nursing note and vitals reviewed.    ED Treatments / Results  DIAGNOSTIC STUDIES: Oxygen Saturation is 98% on RA, normal by my interpretation.  COORDINATION OF CARE: 12:36 AM-Will order blood work. Discussed treatment plan with pt at bedside and pt agreed to plan.   Labs (all labs ordered are listed, but only abnormal results are displayed) Labs Reviewed  CBC WITH DIFFERENTIAL/PLATELET - Abnormal; Notable for the following:       Result Value   WBC 14.2 (*)    Neutro Abs 8.9 (*)    Lymphs Abs 4.2 (*)    All other components within normal limits  COMPREHENSIVE METABOLIC PANEL - Abnormal; Notable for the following:    Calcium 8.8 (*)    All other components within normal limits  URINALYSIS, ROUTINE W REFLEX MICROSCOPIC (NOT AT [facility]) - Abnormal; Notable for the following:     APPearance HAZY (*)    Protein, ur TRACE (*)    Leukocytes, UA TRACE (*)    All other components within normal limits  URINE MICROSCOPIC-ADD ON - Abnormal; Notable for the following:    Squamous Epithelial / LPF TOO NUMEROUS TO COUNT (*)    Bacteria, UA MANY (*)    All other components within normal limits  LIPASE, BLOOD  TROPONIN I  D-DIMER, QUANTITATIVE (NOT AT [facility])  PREGNANCY, URINE    EKG  EKG Interpretation  Date/Time:  Tuesday July 18 2016 00:54:42 EDT Ventricular Rate:  64 PR Interval:    QRS Duration: 82 QT Interval:  410 QTC Calculation: 423 R Axis:   23 Text Interpretation:  Sinus rhythm No significant change was found Confirmed by Manus Gunning  MD, Kamilia Carollo [phone]) on 07/18/2016 1:04:37 AM       Radiology Dg Chest 2 View  Result Date: 07/18/2016 CLINICAL DATA:  30 year old female with chest pain. EXAM: CHEST  2 VIEW COMPARISON:  Chest radiograph dated 07/02/2016 FINDINGS: The heart size and mediastinal contours are within normal limits. Both lungs are clear. The visualized skeletal structures are unremarkable. IMPRESSION: No active cardiopulmonary disease. Electronically Signed   By: Elgie Collard M.D.   On: 07/18/2016 01:16    Procedures Procedures (including critical care time)  Medications Ordered in ED Medications - No data to display   Initial Impression / Assessment and Plan / ED Course  I have reviewed the triage vital signs and the nursing notes.  Pertinent labs & imaging results that were available during my care of the patient were reviewed by me and considered in my medical decision making (see chart for details).  Clinical Course  Several month history of left-sided chest pain that has been intermittent but constant now for the past 3 days. Patient with his previous ED visits and workup with anxiety suspected as cause of pain.  Chest pain is not reproducible. EKG is normal sinus rhythm without acute change.  Patient feels improved with GI  cocktail. She admits she has not filled the Zantac or Carafate prescriptions that were given to her during her previous ED visits. RUQ [place] on 9/17 negative. LFTs and lipase were normal. Leukocytosis as previous. UA contaminated. Troponin and d-dimer negative. Low suspicion for cardiac etiology of her pain. Doubt pulmonary embolism.  Discussed with patient she should follow up with GI encouraged to call the office for a sooner appointment. Message sent to Dr. Karilyn Cota.  Start taking Zantac as previously recommended. Could also try Prilosec. Needs to establish care with PCP. Return precautions discussed.  BP 120/81   Pulse 65   Temp 98.1 F (36.7 C) (Oral)   Resp 19   Ht 5\' 7"  (1.702 m)   Wt 260 lb (117.9 kg)   LMP 06/30/2016   SpO2 95%   BMI 40.72 kg/m   Final Clinical Impressions(s) / ED Diagnoses   Final diagnoses:  Atypical chest pain  Upper abdominal pain    New Prescriptions New Prescriptions   No medications on file  I personally performed the services described in this documentation, which was scribed in my presence. The recorded information has been reviewed and is accurate.     Glynn Octave, MD 07/18/16 [phone]

## 2016-07-18 NOTE — Discharge Instructions (Signed)
Take the stomach medication as prescribed. Followup with Dr. Karilyn Cotaehman. Return to the ED if you develop new or worsening symptoms.

## 2016-07-18 NOTE — ED Triage Notes (Signed)
Pt c/o left side chest pain that radiates down left arm; pt states she has been having these pains x3 days and states she vomits every time she eats

## 2016-07-18 NOTE — ED Notes (Signed)
Patient transported to X-ray 

## 2016-07-19 LAB — URINE CULTURE

## 2016-07-21 DIAGNOSIS — R1084 Generalized abdominal pain: Secondary | ICD-10-CM | POA: Insufficient documentation

## 2016-07-21 DIAGNOSIS — Z87891 Personal history of nicotine dependence: Secondary | ICD-10-CM | POA: Insufficient documentation

## 2016-07-21 DIAGNOSIS — Z79899 Other long term (current) drug therapy: Secondary | ICD-10-CM | POA: Insufficient documentation

## 2016-07-21 DIAGNOSIS — J45909 Unspecified asthma, uncomplicated: Secondary | ICD-10-CM | POA: Insufficient documentation

## 2016-07-21 NOTE — ED Triage Notes (Signed)
Pt reports RLQ abdominal pain that is intermittent and comes on suddenly.  Pt states she has been experiencing chills.  +Nausea.  +urinary frequency/foul odor.  Pt also reports that she has been having issues w/ her glucose dropping into the 60's.

## 2016-07-22 ENCOUNTER — Emergency Department (HOSPITAL_COMMUNITY)
Admission: EM | Admit: 2016-07-22 | Discharge: 2016-07-22 | Disposition: A | Discharge status: Home / Self Care | Payer: Self-pay | Attending: Emergency Medicine | Admitting: Emergency Medicine

## 2016-07-22 ENCOUNTER — Encounter (HOSPITAL_COMMUNITY): Payer: Self-pay | Admitting: Oncology

## 2016-07-22 ENCOUNTER — Emergency Department (HOSPITAL_COMMUNITY): Payer: Self-pay

## 2016-07-22 DIAGNOSIS — R1084 Generalized abdominal pain: Secondary | ICD-10-CM

## 2016-07-22 LAB — URINALYSIS, ROUTINE W REFLEX MICROSCOPIC
Bilirubin Urine: NEGATIVE
Glucose, UA: NEGATIVE mg/dL
Hgb urine dipstick: NEGATIVE
Ketones, ur: NEGATIVE mg/dL
Leukocytes, UA: NEGATIVE
NITRITE: NEGATIVE
PH: 5.5 (ref 5.0–8.0)
Protein, ur: NEGATIVE mg/dL
SPECIFIC GRAVITY, URINE: 1.014 (ref 1.005–1.030)

## 2016-07-22 LAB — COMPREHENSIVE METABOLIC PANEL
ALK PHOS: 62 U/L (ref 38–126)
ALT: 25 U/L (ref 14–54)
ANION GAP: 8 (ref 5–15)
AST: 27 U/L (ref 15–41)
Albumin: 4.2 g/dL (ref 3.5–5.0)
BUN: 11 mg/dL (ref 6–20)
CO2: 21 mmol/L — AB (ref 22–32)
Calcium: 8.8 mg/dL — ABNORMAL LOW (ref 8.9–10.3)
Chloride: 107 mmol/L (ref 101–111)
Creatinine, Ser: 0.76 mg/dL (ref 0.44–1.00)
GFR calc non Af Amer: >60 mL/min (ref >60)
Glucose, Bld: 91 mg/dL (ref 65–99)
POTASSIUM: 4.2 mmol/L (ref 3.5–5.1)
SODIUM: 136 mmol/L (ref 135–145)
Total Bilirubin: 0.8 mg/dL (ref 0.3–1.2)
Total Protein: 7.6 g/dL (ref 6.5–8.1)

## 2016-07-22 LAB — CBC
HCT: 40 % (ref 36.0–46.0)
HEMOGLOBIN: 13.4 g/dL (ref 12.0–15.0)
MCH: 29 pg (ref 26.0–34.0)
MCHC: 33.5 g/dL (ref 30.0–36.0)
MCV: 86.6 fL (ref 78.0–100.0)
Platelets: 328 10*3/uL (ref 150–400)
RBC: 4.62 MIL/uL (ref 3.87–5.11)
RDW: 13.5 % (ref 11.5–15.5)
WBC: 14.7 10*3/uL — ABNORMAL HIGH (ref 4.0–10.5)

## 2016-07-22 LAB — LIPASE, BLOOD: LIPASE: 24 U/L (ref 11–51)

## 2016-07-22 LAB — CBG MONITORING, ED: GLUCOSE-CAPILLARY: 82 mg/dL (ref 65–99)

## 2016-07-22 LAB — PREGNANCY, URINE: PREG TEST UR: NEGATIVE

## 2016-07-22 MED ORDER — IOPAMIDOL (ISOVUE-300) INJECTION 61%
15.0000 mL | Freq: Once | INTRAVENOUS | Status: AC | PRN
  Administered 2016-07-22: 15 mL via ORAL

## 2016-07-22 NOTE — ED Notes (Signed)
Called lab to run pregnancy urine.

## 2016-07-22 NOTE — Discharge Instructions (Signed)
You may take Tylenol or ibuprofen as desired for pain control. Follow-up with a gastroenterologist. You may return as needed for new or concerning symptoms.

## 2016-07-22 NOTE — ED Provider Notes (Signed)
[facility]-EMERGENCY DEPT Provider Note   CSN: [id number] Arrival date & time: 07/21/16  2330    History   Chief Complaint Chief Complaint  Patient presents with  . Abdominal Pain    HPI Hannah Lambert is a 30 y.o. female.  30 y/o female with hx of anxiety and depression presents to the ED for evaluation of abdominal pain. Patient c/o RLQ pain that has been intermittent and waxing and waning in severity. Patient states that symptoms are associated with lightheadedness and chills. She reports nausea without emesis. She also states that she has needed to continuously urinate x 24 hours. This urgency and frequency is not associated with a sensation of incomplete bladder emptying. No reported dysuria or hematuria. Patient seen 3 days ago for chest pain. She has been evaluated in the ED x 5 in the last 6 months. She states that she has been referred to GI in the past, but is awaiting her scheduled appointment. She is unsure of whether her symptoms are secondary to starting Carafate recently.   The history is provided by the patient, a parent and a relative. No language interpreter was used.    Past Medical History:  Diagnosis Date  . Anxiety   . Asthma    as a child  . Depression   . Family history of adverse reaction to anesthesia    post op nausea    Patient Active Problem List   Diagnosis Date Noted  . Malnutrition of moderate degree 12/25/2015  . Acute cholecystitis 12/24/2015  . Leukocytosis 12/24/2015  . Transaminitis 12/24/2015  . Depression with anxiety 12/24/2015  . Gallstones 12/24/2015  . Cholecystitis 12/24/2015  . Overweight 12/21/2014    Past Surgical History:  Procedure Laterality Date  . CHOLECYSTECTOMY N/A 12/29/2015   Procedure: LAPAROSCOPIC CHOLECYSTECTOMY WITH INTRAOPERATIVE CHOLANGIOGRAM;  Surgeon: Franky Macho, MD;  Location: [facility] ORS;  Service: General;  Laterality: N/A;  . DIAGNOSTIC LAPAROSCOPIC LIVER BIOPSY  12/29/2015   Procedure: LAPAROSCOPIC LIVER  BIOPSY;  Surgeon: Franky Macho, MD;  Location: [facility] ORS;  Service: General;;    OB History    Gravida Para Term Preterm AB Living   0 0 0 0 0 0   SAB TAB Ectopic Multiple Live Births   0 0 0 0         Home Medications    Prior to Admission medications   Medication Sig Start Date End Date Taking? Authorizing Provider  clonazePAM (KLONOPIN) 1 MG tablet Take 1 mg by mouth 2 (two) times daily as needed for anxiety.   Yes Historical Provider, MD  FLUoxetine (PROZAC) 20 MG tablet Take 20 mg by mouth daily.   Yes Historical Provider, MD  LORazepam (ATIVAN) 0.5 MG tablet Take 1 tablet (0.5 mg total) by mouth 3 (three) times daily as needed for anxiety. 07/02/16  Yes Jaime Pilcher Ward, PA-C  Multiple Vitamin (MULTIVITAMIN WITH MINERALS) TABS tablet Take 1 tablet by mouth daily.   Yes Historical Provider, MD  ranitidine (ZANTAC) 150 MG tablet Take 1 tablet (150 mg total) by mouth 2 (two) times daily. Patient taking differently: Take 150 mg by mouth 2 (two) times daily as needed for heartburn.  06/14/16  Yes Ivery Quale, PA-C  sucralfate (CARAFATE) 1 g tablet Take 1 tablet (1 g total) by mouth 4 (four) times daily -  with meals and at bedtime. 07/02/16  Yes Jaime Pilcher Ward, PA-C  Vitamin D, Ergocalciferol, (DRISDOL) 50000 units CAPS capsule Take 50,000 Units by mouth every 7 (seven) days.  Yes Historical Provider, MD  hydrOXYzine (ATARAX/VISTARIL) 25 MG tablet Take 1 tablet (25 mg total) by mouth every 6 (six) hours as needed for anxiety. Patient not taking: Reported on 07/22/2016 06/12/16   Tilden FossaElizabeth Rees, MD  omeprazole (PRILOSEC) 20 MG capsule Take 1 capsule (20 mg total) by mouth daily. Patient not taking: Reported on 07/22/2016 07/18/16   Glynn OctaveStephen Rancour, MD  ondansetron (ZOFRAN ODT) 4 MG disintegrating tablet Take 1 tablet (4 mg total) by mouth every 8 (eight) hours as needed for nausea or vomiting. Patient not taking: Reported on 12/19/2015 12/21/14   Gwenlyn FoundJessica C Copland, MD  ondansetron (ZOFRAN) 4  MG tablet Take 1 tablet (4 mg total) by mouth every 6 (six) hours. Patient not taking: Reported on 07/22/2016 07/18/16   Glynn OctaveStephen Rancour, MD  oxyCODONE (OXY IR/ROXICODONE) 5 MG immediate release tablet Take 1 tablet (5 mg total) by mouth every 4 (four) hours as needed for moderate pain. Patient not taking: Reported on 06/12/2016 12/31/15   Franky MachoMark Jenkins, MD  oxyCODONE-acetaminophen (PERCOCET/ROXICET) 5-325 MG tablet Take 1-2 tablets by mouth every 4 (four) hours as needed for severe pain. Patient not taking: Reported on 06/12/2016 01/08/16   Raeford RazorStephen Kohut, MD  polyethylene glycol [facility](MIRALAX / Ethelene HalGLYCOLAX) packet Take 17 g by mouth 2 (two) times daily as needed. Patient not taking: Reported on 06/12/2016 01/08/16   Raeford RazorStephen Kohut, MD  promethazine (PHENERGAN) 25 MG tablet Take 1 tablet (25 mg total) by mouth every 6 (six) hours as needed for nausea or vomiting. Patient not taking: Reported on 01/07/2016 12/19/15   Bethann BerkshireJoseph Zammit, MD  traMADol (ULTRAM) 50 MG tablet Take 1 tablet (50 mg total) by mouth every 6 (six) hours as needed. Patient not taking: Reported on 01/07/2016 12/19/15   Bethann BerkshireJoseph Zammit, MD    Family History Family History  Problem Relation Age of Onset  . Hypertension Mother   . Hypertension Father   . Hypertension Maternal Grandmother   . Mental illness Maternal Grandmother   . Mental illness Paternal Grandmother     Social History Social History  Substance Use Topics  . Smoking status: Former Smoker    Types: Cigarettes    Quit date: 07/17/2015  . Smokeless tobacco: Never Used     Comment: 1/4 ppd  . Alcohol use 0.0 oz/week     Comment: occasional     Allergies   Review of patient's allergies indicates no known allergies.   Review of Systems Review of Systems Ten systems reviewed and are negative for acute change, except as noted in the HPI.    Physical Exam Updated Vital Signs BP 135/89 (BP Location: Right Arm)   Pulse 81   Temp 98.2 F (36.8 C) (Oral)   Resp 18   Ht 5\' 7"   (1.702 m)   Wt 117.9 kg   LMP 06/30/2016 Comment: negative pregnancy test result.  SpO2 98%   BMI 40.72 kg/m   Physical Exam  Constitutional: She is oriented to person, place, and time. She appears well-developed and well-nourished. No distress.  Nontoxic appearing and in NAD  HENT:  Head: Normocephalic and atraumatic.  Eyes: Conjunctivae and EOM are normal. No scleral icterus.  Neck: Normal range of motion.  Cardiovascular: Normal rate, regular rhythm and intact distal pulses.   Pulmonary/Chest: Effort normal. No respiratory distress. She has no wheezes.  Respirations even and unlabored. Lungs CTAB.  Abdominal: Soft. She exhibits no distension and no mass. There is tenderness. There is no guarding.  Generalized TTP. No focal tenderness. No  masses or peritoneal signs. Abdomen soft and obese.  Musculoskeletal: Normal range of motion.  Neurological: She is alert and oriented to person, place, and time. She exhibits normal muscle tone. Coordination normal.  Skin: Skin is warm and dry. No rash noted. She is not diaphoretic. No erythema. No pallor.  Psychiatric: She has a normal mood and affect. Her behavior is normal.  Nursing note and vitals reviewed.    ED Treatments / Results  Labs (all labs ordered are listed, but only abnormal results are displayed) Labs Reviewed  COMPREHENSIVE METABOLIC PANEL - Abnormal; Notable for the following:       Result Value   CO2 21 (*)    Calcium 8.8 (*)    All other components within normal limits  CBC - Abnormal; Notable for the following:    WBC 14.7 (*)    All other components within normal limits  LIPASE, BLOOD  URINALYSIS, ROUTINE W REFLEX MICROSCOPIC (NOT AT [facility])  PREGNANCY, URINE  CBG MONITORING, ED    EKG  EKG Interpretation None       Radiology Ct Abdomen Pelvis Wo Contrast  Result Date: 07/22/2016 CLINICAL DATA:  Right lower quadrant abdominal pain. EXAM: CT ABDOMEN AND PELVIS WITHOUT CONTRAST TECHNIQUE: Multidetector CT  imaging of the abdomen and pelvis was performed following the standard protocol without IV contrast. COMPARISON:  None. FINDINGS: Lower chest: No pulmonary nodules. No visible pleural or pericardial effusion. Hepatobiliary: Normal hepatic size and contours without focal liver lesion. No perihepatic ascites. No intra- or extrahepatic biliary dilatation. Gallbladder surgically absent. Pancreas: Normal pancreatic contours and enhancement. No peripancreatic fluid collection or pancreatic ductal dilatation. Spleen: Normal. Adrenals/Urinary Tract: Normal adrenal glands. No hydronephrosis or solid renal mass. Stomach/Bowel: No abnormal bowel dilatation. No bowel wall thickening or adjacent fat stranding to indicate acute inflammation. No abdominal fluid collection. Normal appendix. Vascular/Lymphatic: Normal course and caliber of the major abdominal vessels. No abdominal or pelvic adenopathy. Reproductive: Normal uterus and ovaries. No free fluid in the pelvis. Musculoskeletal: No lytic or blastic osseous lesion. There is incomplete fusion of the posterior elements at L5. Normal visualized extrathoracic and extraperitoneal soft tissues. Other: There is a lesion of the lumbar spinal canal with attenuation value consistent with fat (axial image 43). The mass measures 7 x 9 x 31 mm. IMPRESSION: 1. No acute abnormality of the abdomen or pelvis.  Normal appendix. 2. 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 Nonemergent MRI of the lumbar spine with and without contrast may be confirmatory. Electronically Signed   By: Deatra Robinson M.D.   On: 07/22/2016 05:47    Procedures Procedures (including critical care time)  Medications Ordered in ED Medications  iopamidol (ISOVUE-300) 61 % injection 15 mL (15 mLs Oral Contrast Given 07/22/16 0433)     Initial Impression / Assessment and Plan / ED Course  I have reviewed the triage vital signs  and the nursing notes.  Pertinent labs & imaging results that were available during my care of the patient were reviewed by me and considered in my medical decision making (see chart for details).  Clinical Course    Patient is nontoxic, nonseptic appearing, in no apparent distress. Labs and vitals reviewed. Patient does not meet the SIRS or Sepsis criteria. No evidence of acute surgical abdomen on exam. CT is also negative for acute process. Question psychosomatic etiology. I have urged the patient to follow up with GI at her  upcoming scheduled appointment. Patient declines Rx for home for outpatient management; I do not believe admission or further emergent work up is indicated at this time. Return precautions discussed and provided. Patient discharged in stable condition with no unaddressed concerns.   Final Clinical Impressions(s) / ED Diagnoses   Final diagnoses:  Generalized abdominal pain    New Prescriptions New Prescriptions   No medications on file     Antony Madura, PA-C 07/25/16 [id number]    Paula Libra, MD 07/25/16 1109

## 2016-07-22 NOTE — ED Notes (Signed)
Patient ambulatory to restroom  ?

## 2016-07-22 NOTE — ED Notes (Signed)
Patient states that has no history of being diabetic.  Patient and mother asked to check patient glucose.

## 2016-07-28 ENCOUNTER — Emergency Department (HOSPITAL_COMMUNITY)
Admission: EM | Admit: 2016-07-28 | Discharge: 2016-07-28 | Disposition: A | Discharge status: Home / Self Care | Payer: Self-pay | Attending: Emergency Medicine | Admitting: Emergency Medicine

## 2016-07-28 ENCOUNTER — Emergency Department (HOSPITAL_COMMUNITY): Payer: Self-pay

## 2016-07-28 ENCOUNTER — Encounter (HOSPITAL_COMMUNITY): Payer: Self-pay | Admitting: Emergency Medicine

## 2016-07-28 DIAGNOSIS — Z87891 Personal history of nicotine dependence: Secondary | ICD-10-CM | POA: Insufficient documentation

## 2016-07-28 DIAGNOSIS — J45909 Unspecified asthma, uncomplicated: Secondary | ICD-10-CM | POA: Insufficient documentation

## 2016-07-28 DIAGNOSIS — M545 Low back pain, unspecified: Secondary | ICD-10-CM

## 2016-07-28 LAB — URINALYSIS, ROUTINE W REFLEX MICROSCOPIC
BILIRUBIN URINE: NEGATIVE
Glucose, UA: NEGATIVE mg/dL
KETONES UR: NEGATIVE mg/dL
LEUKOCYTES UA: NEGATIVE
NITRITE: NEGATIVE
Protein, ur: NEGATIVE mg/dL
Specific Gravity, Urine: 1.01 (ref 1.005–1.030)
pH: 8 (ref 5.0–8.0)

## 2016-07-28 LAB — URINE MICROSCOPIC-ADD ON

## 2016-07-28 LAB — PREGNANCY, URINE: Preg Test, Ur: NEGATIVE

## 2016-07-28 MED ORDER — IBUPROFEN 400 MG PO TABS
600.0000 mg | ORAL_TABLET | Freq: Once | ORAL | Status: AC
  Administered 2016-07-28: 600 mg via ORAL
  Filled 2016-07-28: qty 2

## 2016-07-28 MED ORDER — HYDROCODONE-ACETAMINOPHEN 5-325 MG PO TABS
1.0000 | ORAL_TABLET | Freq: Once | ORAL | Status: AC
  Administered 2016-07-28: 1 via ORAL
  Filled 2016-07-28: qty 1

## 2016-07-28 NOTE — Discharge Instructions (Signed)
THE results in the ER today are normal. Your weakness in the leg has resolved, and so we are comfortable sending you home.  As discussed, there is lipoma possibility per last CT. CT is as below: IMPRESSION: 1. No acute abnormality of the abdomen or pelvis.  Normal appendix. 2. 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 Nonemergent MRI of the lumbar spine with and without contrast may be confirmatory.  If there is any new numbness in the legs, persistent weakness in the legs, urinary incontinence, urinary retention, bowel incontinence, or tingling around the vagina or rectal region - please come to the ER immediately.

## 2016-07-28 NOTE — ED Triage Notes (Signed)
Pt c/o back pain with bilateral leg weakness for a couple of days.

## 2016-07-28 NOTE — ED Provider Notes (Signed)
[facility] DEPT Provider Note   CSN: [id number] Arrival date & time: 07/28/16  0159     History   Chief Complaint Chief Complaint  Patient presents with  . Back Pain    HPI Hannah Lambert is a 30 y.o. female.  HPI Pt comes in with cc of back pain. Pt reports that she suddenly had lower back pain, felt like her legs were weak and she might fall - which led to her panic attack setting in and she almost fainted. Her pain was unprovoked. Pt is unsure if there was numbness or pain shooting down her legs. No current numbness, weakness, urinary incontinence, urinary retention, bowel incontinence,  Or paresthesias in the perineal region. Pt's CT from earlier this week reviewed - and there was concerns for lipoma.  Pt denies abd pain, uti like symptoms.   Past Medical History:  Diagnosis Date  . Anxiety   . Asthma    as a child  . Depression   . Family history of adverse reaction to anesthesia    post op nausea    Patient Active Problem List   Diagnosis Date Noted  . Malnutrition of moderate degree 12/25/2015  . Acute cholecystitis 12/24/2015  . Leukocytosis 12/24/2015  . Transaminitis 12/24/2015  . Depression with anxiety 12/24/2015  . Gallstones 12/24/2015  . Cholecystitis 12/24/2015  . Overweight 12/21/2014    Past Surgical History:  Procedure Laterality Date  . CHOLECYSTECTOMY N/A 12/29/2015   Procedure: LAPAROSCOPIC CHOLECYSTECTOMY WITH INTRAOPERATIVE CHOLANGIOGRAM;  Surgeon: Franky Macho, MD;  Location: [facility] ORS;  Service: General;  Laterality: N/A;  . DIAGNOSTIC LAPAROSCOPIC LIVER BIOPSY  12/29/2015   Procedure: LAPAROSCOPIC LIVER BIOPSY;  Surgeon: Franky Macho, MD;  Location: [facility];  Service: General;;    OB History    Gravida Para Term Preterm AB Living   0 0 0 0 0 0   SAB TAB Ectopic Multiple Live Births   0 0 0 0         Home Medications    Prior to Admission medications   Medication Sig Start Date End Date Taking? Authorizing Provider    clonazePAM (KLONOPIN) 1 MG tablet Take 1 mg by mouth 2 (two) times daily as needed for anxiety.    Historical Provider, MD  FLUoxetine (PROZAC) 20 MG tablet Take 20 mg by mouth daily.    Historical Provider, MD  hydrOXYzine (ATARAX/VISTARIL) 25 MG tablet Take 1 tablet (25 mg total) by mouth every 6 (six) hours as needed for anxiety. Patient not taking: Reported on 07/22/2016 06/12/16   Tilden Fossa, MD  LORazepam (ATIVAN) 0.5 MG tablet Take 1 tablet (0.5 mg total) by mouth 3 (three) times daily as needed for anxiety. 07/02/16   Chase Picket Ward, PA-C  Multiple Vitamin (MULTIVITAMIN WITH MINERALS) TABS tablet Take 1 tablet by mouth daily.    Historical Provider, MD  omeprazole (PRILOSEC) 20 MG capsule Take 1 capsule (20 mg total) by mouth daily. Patient not taking: Reported on 07/22/2016 07/18/16   Glynn Octave, MD  ondansetron (ZOFRAN ODT) 4 MG disintegrating tablet Take 1 tablet (4 mg total) by mouth every 8 (eight) hours as needed for nausea or vomiting. Patient not taking: Reported on 12/19/2015 12/21/14   Gwenlyn Found Copland, MD  ondansetron (ZOFRAN) 4 MG tablet Take 1 tablet (4 mg total) by mouth every 6 (six) hours. Patient not taking: Reported on 07/22/2016 07/18/16   Glynn Octave, MD  oxyCODONE (OXY IR/ROXICODONE) 5 MG immediate release tablet Take 1 tablet (5 mg  total) by mouth every 4 (four) hours as needed for moderate pain. Patient not taking: Reported on 06/12/2016 12/31/15   Franky Macho, MD  oxyCODONE-acetaminophen (PERCOCET/ROXICET) 5-325 MG tablet Take 1-2 tablets by mouth every 4 (four) hours as needed for severe pain. Patient not taking: Reported on 06/12/2016 01/08/16   Raeford Razor, MD  polyethylene glycol [facility]) packet Take 17 g by mouth 2 (two) times daily as needed. Patient not taking: Reported on 06/12/2016 01/08/16   Raeford Razor, MD  promethazine (PHENERGAN) 25 MG tablet Take 1 tablet (25 mg total) by mouth every 6 (six) hours as needed for nausea or  vomiting. Patient not taking: Reported on 01/07/2016 12/19/15   Bethann Berkshire, MD  ranitidine (ZANTAC) 150 MG tablet Take 1 tablet (150 mg total) by mouth 2 (two) times daily. Patient taking differently: Take 150 mg by mouth 2 (two) times daily as needed for heartburn.  06/14/16   Ivery Quale, PA-C  sucralfate (CARAFATE) 1 g tablet Take 1 tablet (1 g total) by mouth 4 (four) times daily -  with meals and at bedtime. 07/02/16   Chase Picket Ward, PA-C  traMADol (ULTRAM) 50 MG tablet Take 1 tablet (50 mg total) by mouth every 6 (six) hours as needed. Patient not taking: Reported on 01/07/2016 12/19/15   Bethann Berkshire, MD  Vitamin D, Ergocalciferol, (DRISDOL) 50000 units CAPS capsule Take 50,000 Units by mouth every 7 (seven) days.    Historical Provider, MD    Family History Family History  Problem Relation Age of Onset  . Hypertension Mother   . Hypertension Father   . Hypertension Maternal Grandmother   . Mental illness Maternal Grandmother   . Mental illness Paternal Grandmother     Social History Social History  Substance Use Topics  . Smoking status: Former Smoker    Types: Cigarettes    Quit date: 07/17/2015  . Smokeless tobacco: Never Used     Comment: 1/4 ppd  . Alcohol use 0.0 oz/week     Comment: occasional     Allergies   Review of patient's allergies indicates no known allergies.   Review of Systems Review of Systems  ROS 10 Systems reviewed and are negative for acute change except as noted in the HPI.     Physical Exam Updated Vital Signs BP 152/96 (BP Location: Left Arm)   Pulse 89   Temp 97.8 F (36.6 C) (Oral)   Resp 22   Ht 5\' 7"  (1.702 m)   Wt 260 lb (117.9 kg)   LMP 07/28/2016 Comment: negative pregnancy test result.  SpO2 97%   BMI 40.72 kg/m   Physical Exam  Constitutional: She is oriented to person, place, and time. She appears well-developed and well-nourished.  HENT:  Head: Normocephalic and atraumatic.  Eyes: EOM are normal. Pupils are  equal, round, and reactive to light.  Neck: Neck supple.  Cardiovascular: Normal rate, regular rhythm and normal heart sounds.   No murmur heard. Pulmonary/Chest: Effort normal. No respiratory distress.  Abdominal: Soft. She exhibits no distension. There is no tenderness. There is no rebound and no guarding.  Musculoskeletal:  Pt has tenderness over the lumbar region No step offs, no erythema. Pt has 2+ patellar reflex bilaterally. Able to discriminate between sharp and dull. Able to ambulate Negative active and passive straight leg raise  Neurological: She is alert and oriented to person, place, and time.  Skin: Skin is warm and dry.     ED Treatments / Results  Labs (  all labs ordered are listed, but only abnormal results are displayed) Labs Reviewed  URINALYSIS, ROUTINE W REFLEX MICROSCOPIC (NOT AT [facility]) - Abnormal; Notable for the following:       Result Value   Hgb urine dipstick LARGE (*)    All other components within normal limits  URINE MICROSCOPIC-ADD ON - Abnormal; Notable for the following:    Squamous Epithelial / LPF 0-5 (*)    Bacteria, UA FEW (*)    All other components within normal limits  PREGNANCY, URINE    EKG  EKG Interpretation None       Radiology Dg Lumbar Spine Complete  Result Date: 07/28/2016 CLINICAL DATA:  Lumbosacral back pain, mid and lower. Bilateral leg weakness. EXAM: LUMBAR SPINE - COMPLETE 4+ VIEW COMPARISON:  CT abdomen/ pelvis 07/22/2016 FINDINGS: There are 6 non-rib-bearing lumbar vertebra. Alignment is maintained. Limbic vertebra at L5. Unilateral left L5 pars defect, better appreciated on CT. No listhesis. Normal vertebral body heights. No fracture or focal osseous lesion. IMPRESSION: 1. No acute bony abnormality. Incidental note of 6 non-rib-bearing lumbar vertebra and limbic vertebra at L5. 2. The fat density intradural mass at L2-L3 level on recent CT is not a structure visualized radiographically. Electronically Signed   By:  Rubye OaksMelanie  Ehinger M.D.   On: 07/28/2016 03:55    Procedures Procedures (including critical care time)  Medications Ordered in ED Medications  ibuprofen (ADVIL,MOTRIN) tablet 600 mg (600 mg Oral Given 07/28/16 0306)  HYDROcodone-acetaminophen (NORCO/VICODIN) 5-325 MG per tablet 1 tablet (1 tablet Oral Given 07/28/16 0307)     Initial Impression / Assessment and Plan / ED Course  I have reviewed the triage vital signs and the nursing notes.  Pertinent labs & imaging results that were available during my care of the patient were reviewed by me and considered in my medical decision making (see chart for details).  Clinical Course    Pt has back pain with some leg weakness. Symptoms improved. No red flags for cord compression. Had generalized spine tenderness, Xrays of the spine is neg. After the xrays - her CT non contrast was reviewed - and results of that CT discussed. Pt aware of the CT findings. She has mentioned it to the health dept, and her initial request has been declined. I advised her to write a letter to her doctor, or specifically inform her about the family hx of lipoma that needed surgical eval. Currently, there is no need for emergent MRI. Strict ER return precautions have been discussed, and patient is agreeing with the plan and is comfortable with the workup done and the recommendations from the ER. Results from the ER workup discussed with the patient face to face and all questions answered to the best of my ability.   Final Clinical Impressions(s) / ED Diagnoses   Final diagnoses:  Nonspecific low back pain    New Prescriptions New Prescriptions   No medications on file     Derwood KaplanAnkit Maan Zarcone, MD 07/28/16 [phone]

## 2016-08-07 ENCOUNTER — Ambulatory Visit (INDEPENDENT_AMBULATORY_CARE_PROVIDER_SITE_OTHER): Payer: Self-pay | Admitting: Internal Medicine

## 2016-08-07 ENCOUNTER — Encounter (INDEPENDENT_AMBULATORY_CARE_PROVIDER_SITE_OTHER): Payer: Self-pay | Admitting: Internal Medicine

## 2016-08-07 VITALS — BP 108/70 | HR 68 | Temp 98.3°F | Resp 18 | Ht 67.0 in | Wt 262.8 lb

## 2016-08-07 DIAGNOSIS — R0789 Other chest pain: Secondary | ICD-10-CM

## 2016-08-07 DIAGNOSIS — F411 Generalized anxiety disorder: Secondary | ICD-10-CM

## 2016-08-07 DIAGNOSIS — K760 Fatty (change of) liver, not elsewhere classified: Secondary | ICD-10-CM

## 2016-08-07 DIAGNOSIS — K219 Gastro-esophageal reflux disease without esophagitis: Secondary | ICD-10-CM

## 2016-08-07 DIAGNOSIS — K582 Mixed irritable bowel syndrome: Secondary | ICD-10-CM

## 2016-08-07 MED ORDER — BENEFIBER DRINK MIX PO PACK: 4.0000 g | PACK | Freq: Every day | ORAL | Status: DC

## 2016-08-07 MED ORDER — DICYCLOMINE HCL 10 MG PO CAPS: 10.0000 mg | ORAL_CAPSULE | Freq: Three times a day (TID) | ORAL | 2 refills | Status: DC

## 2016-08-07 MED ORDER — LORAZEPAM 0.5 MG PO TABS: 0.5000 mg | ORAL_TABLET | Freq: Two times a day (BID) | ORAL | 1 refills | Status: DC | PRN

## 2016-08-07 MED ORDER — PANTOPRAZOLE SODIUM 40 MG PO TBEC: 40.0000 mg | DELAYED_RELEASE_TABLET | Freq: Every day | ORAL | 3 refills | Status: DC

## 2016-08-07 NOTE — Progress Notes (Signed)
Presenting complaint;  Patient has multiple complaints.  Database and Subjective:  Patient is 30 year old Caucasian female who is here for scheduled visit. She was last seen in March 2017 when she was hospitalized with mildly elevated transaminases. She was felt to have passed a stone. She had laparoscopic cholecystectomy along with intraoperative cholangiogram and liver biopsy. Cholangiogram was unremarkable. She was noted to have steatosis and cholestasis felt to be most likely due to transient biliary obstruction. Subsequent transaminases have remained normal.  Patient now presents with multiple complaints. She complains of abdominal pain and left upper as well as right upper quadrant and also in right low quadrant. Most of her pain is in left upper quadrant and generally occurs after she eats or before she has a bowel movement. With this pain she feels like she is going to pass out. Right-sided pain tends to occur at different times. She also complains of intermittent heartburn. She is taking Zantac on as-needed basis. She also complains of intermittent pain in left precordial region radiating to her left on. She's been seen in emergency room on multiple occasions and evaluation has been negative. Her bowels are irregular. She either has constipation or frequent stools. She can go 3 days without having a bowel movement and then she could have as many as 3 bowel movements in a given day. She denies melena or rectal bleeding. He also complains of being anxious and nervous. She states all of her symptoms scare her. She also complains of intermittent lower extremity weakness. 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 Since then she has [facility] and is scheduled to undergo MRI prior to her next visit. She denies numbness in her lower extremities. She has  never taken hydroxyzine. She is also not taking sucralfate as it makes her feel we had. She denies nausea vomiting or dysphagia.   Current Medications: Outpatient Encounter Prescriptions as of 08/07/2016  Medication Sig  . clonazePAM (KLONOPIN) 1 MG tablet Take 1 mg by mouth 2 (two) times daily as needed for anxiety.  Marland Kitchen. FLUoxetine (PROZAC) 20 MG tablet Take 20 mg by mouth daily.  . hydrOXYzine (ATARAX/VISTARIL) 25 MG tablet Take 1 tablet (25 mg total) by mouth every 6 (six) hours as needed for anxiety.  Marland Kitchen. LORazepam (ATIVAN) 0.5 MG tablet Take 1 tablet (0.5 mg total) by mouth 3 (three) times daily as needed for anxiety.  . Multiple Vitamin (MULTIVITAMIN WITH MINERALS) TABS tablet Take 1 tablet by mouth daily.  . ranitidine (ZANTAC) 150 MG tablet Take 1 tablet (150 mg total) by mouth 2 (two) times daily. (Patient taking differently: Take 150 mg by mouth 2 (two) times daily as needed for heartburn. )  . sucralfate (CARAFATE) 1 g tablet Take 1 tablet (1 g total) by mouth 4 (four) times daily -  with meals and at bedtime.  . Vitamin D, Ergocalciferol, (DRISDOL) 50000 units CAPS capsule Take 50,000 Units by mouth every 7 (seven) days.  . [DISCONTINUED] omeprazole (PRILOSEC) 20 MG capsule Take 1 capsule (20 mg total) by mouth daily. (Patient not taking: Reported on 08/07/2016)  . [DISCONTINUED] ondansetron (ZOFRAN ODT) 4 MG disintegrating tablet Take 1 tablet (4 mg total) by mouth every 8 (eight) hours as needed for nausea or vomiting. (Patient not taking: Reported on 08/07/2016)  . [DISCONTINUED] ondansetron (ZOFRAN) 4 MG tablet Take 1 tablet (4 mg  total) by mouth every 6 (six) hours. (Patient not taking: Reported on 08/07/2016)  . [DISCONTINUED] oxyCODONE (OXY IR/ROXICODONE) 5 MG immediate release tablet Take 1 tablet (5 mg total) by mouth every 4 (four) hours as needed for moderate pain. (Patient not taking: Reported on 08/07/2016)  . [DISCONTINUED] oxyCODONE-acetaminophen (PERCOCET/ROXICET) 5-325 MG  tablet Take 1-2 tablets by mouth every 4 (four) hours as needed for severe pain. (Patient not taking: Reported on 08/07/2016)  . [DISCONTINUED] polyethylene glycol (MIRALAX / GLYCOLAX) packet Take 17 g by mouth 2 (two) times daily as needed. (Patient not taking: Reported on 08/07/2016)  . [DISCONTINUED] promethazine (PHENERGAN) 25 MG tablet Take 1 tablet (25 mg total) by mouth every 6 (six) hours as needed for nausea or vomiting. (Patient not taking: Reported on 08/07/2016)  . [DISCONTINUED] traMADol (ULTRAM) 50 MG tablet Take 1 tablet (50 mg total) by mouth every 6 (six) hours as needed. (Patient not taking: Reported on 08/07/2016)   No facility-administered encounter medications on file as of 08/07/2016.      Objective: Blood pressure 108/70, pulse 68, temperature 98.3 F (36.8 C), temperature source Oral, resp. rate 18, height 5\' 7"  (1.702 m), weight 262 lb 12.8 oz (119.2 kg), last menstrual period 07/28/2016.  Patient is alert and in no acute distress. She has facial acne. Conjunctiva is pink. Sclera is nonicteric Oropharyngeal mucosa is normal. No neck masses or thyromegaly noted. Cardiac exam with regular rhythm normal S1 and S2. No murmur or gallop noted. Lungs are clear to auscultation. Abdomen is obese. Bowel sounds are normal. On palpation abdomen is soft and nontender without organomegaly or masses. No LE edema or clubbing noted.  Labs/studies Results: Abdominal pelvic CT from 07/22/2016 reviewed.  No hepatic or biliary abnormality. Pancreas is unremarkable.   7 x 9 x 31 mm low-attenuation lesion in the lumbar spinal canal consistent with lipoma.  LFTs from 03/11/2016 Bilirubin 0.6, AP 68, AST 20, ALT 23, total protein 6.9 and albumin of 4.0.  Assessment:  #1.  irritable bowel syndrome. Most of her abdominal pain and irregular bowel movements appear to be due to IBS. She also appears to be having vasovagal syndrome with this pain and prior to defecation. #2. GERD. Symptoms  not well controlled with Zantac. He does not have alarm symptoms. #3. atypical chest pain. Troponin levels on multiple ER visits been negative. This may be musculoskeletal pain rather than GERD. #4. fatty liver. Liver biopsy in March 2017 was negative for fibrosis. Transaminases are normal which is reassuring. She needs to come more active and gradually decrease weight in order to prevent cirrhosis down the road. #5. Anxiety.   Plan:  Sucralfate and Zantac discontinued. Pantoprazole 40 mg by mouth every morning. Dicyclomine 10 mg by mouth before meals. Benefiber 4 g by mouth daily at bedtime. Resident exam 0.5 mg by mouth twice a day when necessary prescription given for 60 doses with one refill. Patient advised to keep stool diary until office visit in 3 months.

## 2016-08-07 NOTE — Patient Instructions (Addendum)
Symptom diary as to frequency and consistency of stools until next visit. High fiber diet. Benefiber or equivalent 4 g by mouth daily at bedtime. Can use Dulcolax suppository on as-needed basis. Stool guaiac when when stool is tarry.

## 2016-08-22 DIAGNOSIS — Q068 Other specified congenital malformations of spinal cord: Secondary | ICD-10-CM

## 2016-08-22 HISTORY — DX: Other specified congenital malformations of spinal cord: Q06.8

## 2016-10-03 ENCOUNTER — Ambulatory Visit (INDEPENDENT_AMBULATORY_CARE_PROVIDER_SITE_OTHER): Payer: Self-pay | Admitting: Internal Medicine

## 2016-11-07 ENCOUNTER — Encounter (INDEPENDENT_AMBULATORY_CARE_PROVIDER_SITE_OTHER): Payer: Self-pay | Admitting: Internal Medicine

## 2016-11-07 ENCOUNTER — Ambulatory Visit (INDEPENDENT_AMBULATORY_CARE_PROVIDER_SITE_OTHER): Payer: BLUE CROSS/BLUE SHIELD | Admitting: Internal Medicine

## 2016-11-07 VITALS — BP 134/86 | HR 74 | Temp 98.6°F | Resp 18 | Ht 67.0 in | Wt 278.1 lb

## 2016-11-07 DIAGNOSIS — K582 Mixed irritable bowel syndrome: Secondary | ICD-10-CM

## 2016-11-07 DIAGNOSIS — K219 Gastro-esophageal reflux disease without esophagitis: Secondary | ICD-10-CM | POA: Diagnosis not present

## 2016-11-07 DIAGNOSIS — F411 Generalized anxiety disorder: Secondary | ICD-10-CM

## 2016-11-07 DIAGNOSIS — R635 Abnormal weight gain: Secondary | ICD-10-CM | POA: Diagnosis not present

## 2016-11-07 NOTE — Progress Notes (Signed)
Presenting complaint;  Follow-up for GERD IBS and anxiety state.  Subjective:  Hannah Lambert is 31 year old Caucasian female who is here for scheduled visit. She was last seen on 08/07/2016. Felt to have IBS GERD and anxiety. She is feeling better. She denies heartburn. She is having 1-2 episodes of nausea and vomiting every weeks. If nausea and vomiting occurs in the morning she vomits bile and if it occurs in the evening she vomits bile and food. She says vomiting is projectile. She denies hematemesis or melena. She is doing better as fresh or constipation is concerned. She states she has done better since he's been on fiber one bar. She continues to complain of pain at LLQ. She says she is not able to walk or exercise because of spine issues. She has spina bifida. She developed stiffness can was seen at [facility] emergency room past Saturday and a blood pressure was elevated. She is always worried that she may develop meningitis. She is seeing a neurosurgeon at [facility] in considering surgery. She is still having dizzy spells but no syncope since her last visit. She has gained 16 pounds since her last visit. He says she has never weighed this much.   Current Medications: Outpatient Encounter Prescriptions as of 11/07/2016  Medication Sig  . clonazePAM (KLONOPIN) 1 MG tablet Take 1 mg by mouth at bedtime.   . cyclobenzaprine (FLEXERIL) 10 MG tablet Take 10 mg by mouth at bedtime.   . dicyclomine (BENTYL) 10 MG capsule Take 1 capsule (10 mg total) by mouth 3 (three) times daily before meals.  Marland Kitchen FLUoxetine (PROZAC) 20 MG tablet Take 20 mg by mouth daily.  Marland Kitchen LORazepam (ATIVAN) 0.5 MG tablet Take 1 tablet (0.5 mg total) by mouth 2 (two) times daily as needed for anxiety.  . Multiple Vitamin (MULTIVITAMIN WITH MINERALS) TABS tablet Take 1 tablet by mouth daily.  . pantoprazole (PROTONIX) 40 MG tablet Take 1 tablet (40 mg total) by mouth daily.  . Wheat Dextrin (BENEFIBER DRINK MIX) PACK Take 4 g by mouth at  bedtime.  . [DISCONTINUED] hydrOXYzine (ATARAX/VISTARIL) 25 MG tablet Take 1 tablet (25 mg total) by mouth every 6 (six) hours as needed for anxiety. (Patient not taking: Reported on 11/07/2016)  . [DISCONTINUED] Vitamin D, Ergocalciferol, (DRISDOL) 50000 units CAPS capsule Take 50,000 Units by mouth every 7 (seven) days.   No facility-administered encounter medications on file as of 11/07/2016.      Objective: Blood pressure 134/86, pulse 74, temperature 98.6 F (37 C), temperature source Oral, resp. rate 18, height 5\' 7"  (1.702 m), weight 278 lb 1.6 oz (126.1 kg), last menstrual period 10/21/2016. Patient is alert and in no acute distress. She has facial acne. Conjunctiva is pink. Sclera is nonicteric Oropharyngeal mucosa is normal. No neck masses or thyromegaly noted. Cardiac exam with regular rhythm normal S1 and S2. No murmur or gallop noted. Lungs are clear to auscultation. Abdomen is full. Bowel sounds are normal. On palpation abdomen is soft with mild tenderness at LLQ. No organomegaly or masses. Trace edema noted around ankles.  Labs/studies Results:  TSH was 1.378 on 12/24/2015.  Assessment:  #1. GERD. She is not having heartburn anymore. Frequency of nausea and vomiting spells has decreased. Some of her nausea and vomiting episodes would appear to be due to anxiety or vasovagal phenomenon #2. Irritable bowel syndrome. Now she is having more problems with constipation. #3. Anxiety. She is having difficulty coping with her illness particularly spina bifida contribution to her anxiety and makes her  symptoms worse. She is on low-dose lorazepam when necessary. #4. Weight gain. Patient has gained 16 pounds in the last 3 months. Since the most she has ever weighed. TSH was normal in March 2007. She is consuming too many calories and unable to exercise because of spina bifida. She should try to exercise some using upper body muscles as tolerated.   Plan:  Dicyclomine schedule  changed to 10 mg by mouth 3 times a day when necessary. Food diary for 2 weeks and bring summary to the office. Weight check in one month. Office visit in 6 months.

## 2016-11-07 NOTE — Patient Instructions (Addendum)
Keep diary of food intake for 2 weeks. If possible take dicyclomine 30 minutes before bowel movement. Weight check in one month.

## 2016-11-14 IMAGING — DX DG CHEST 2V
2 series · 2 of 2 positions shown · non-contrast
Comparison: Chest radiograph dated 07/02/2016

CLINICAL DATA: 29-year-old female with chest pain.

EXAM:
CHEST  2 VIEW

[chest pa]
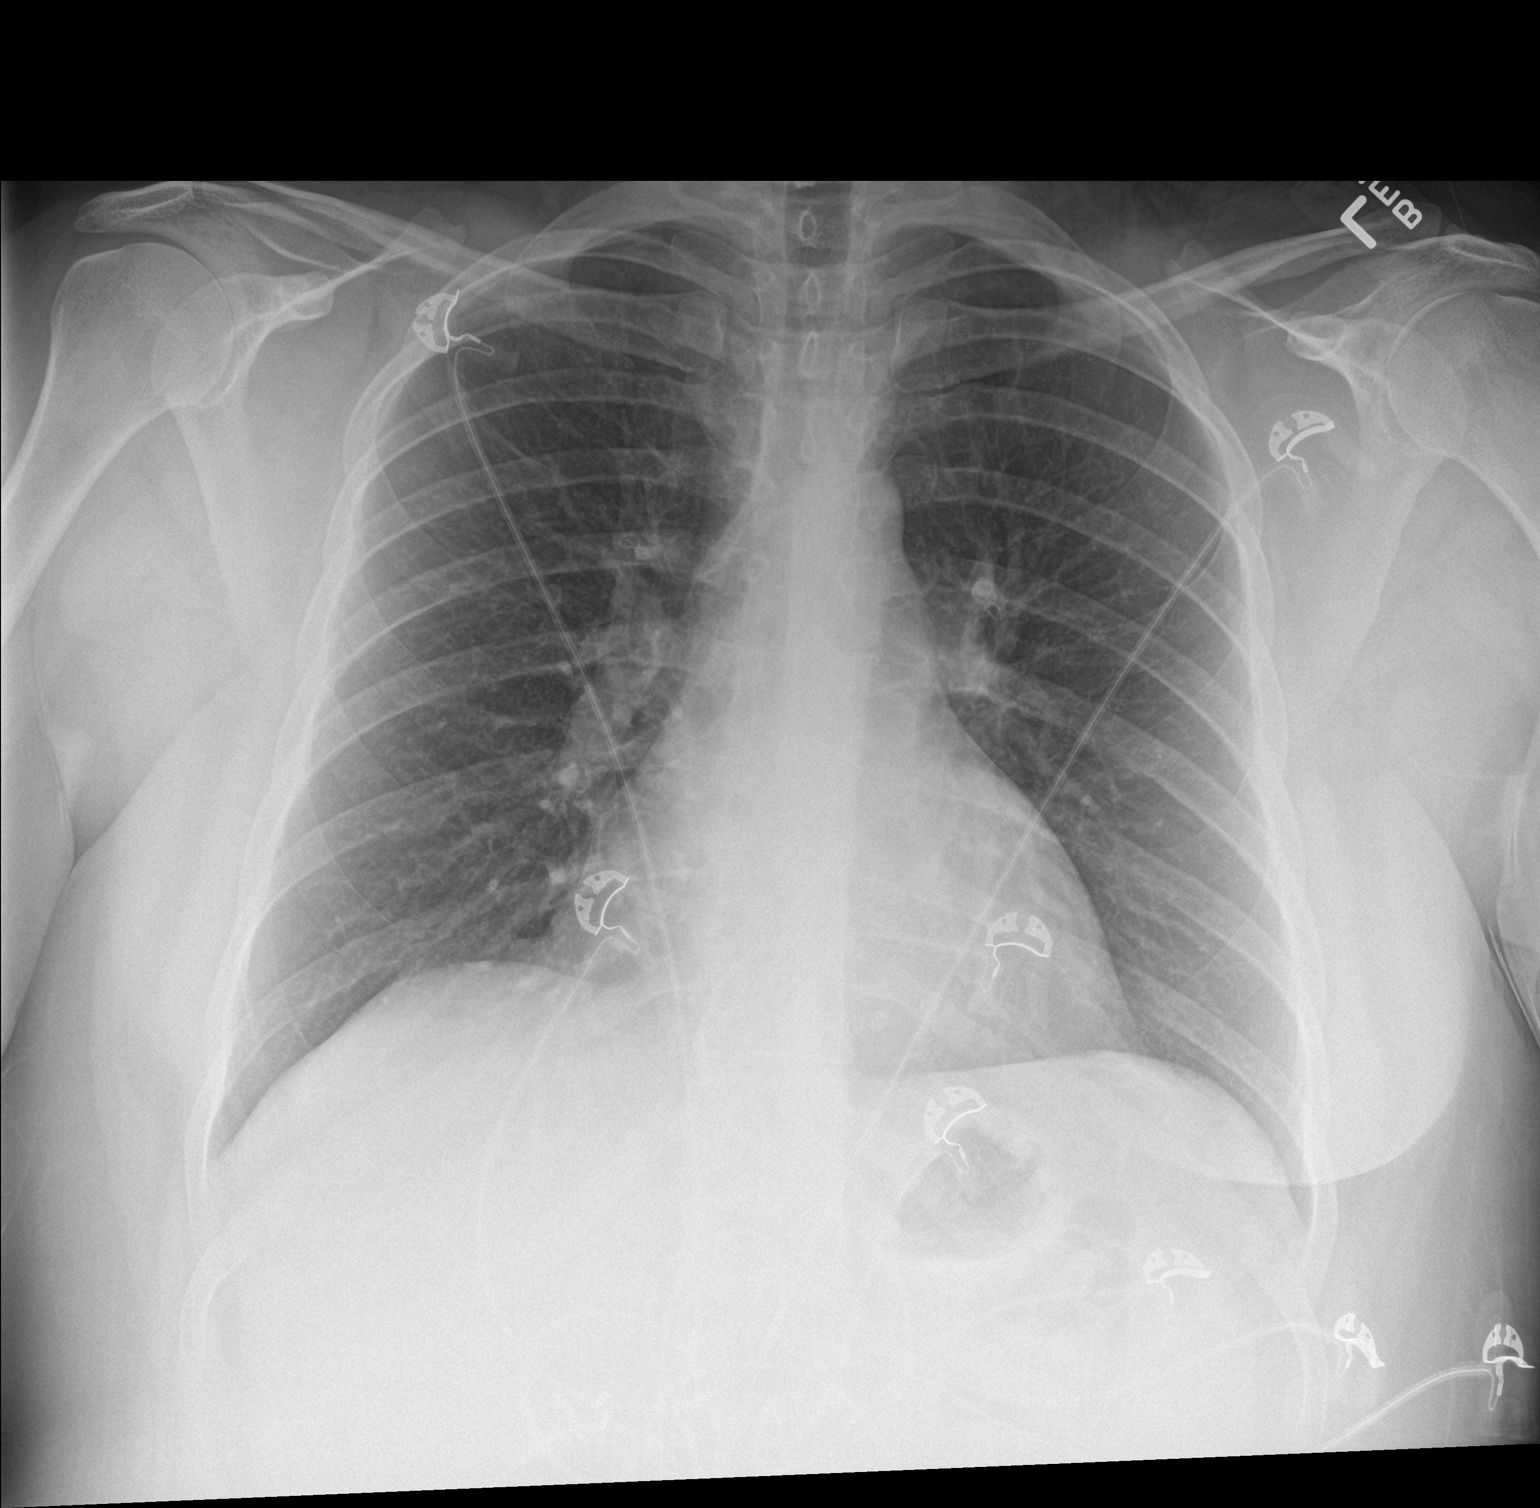

[chest lat]
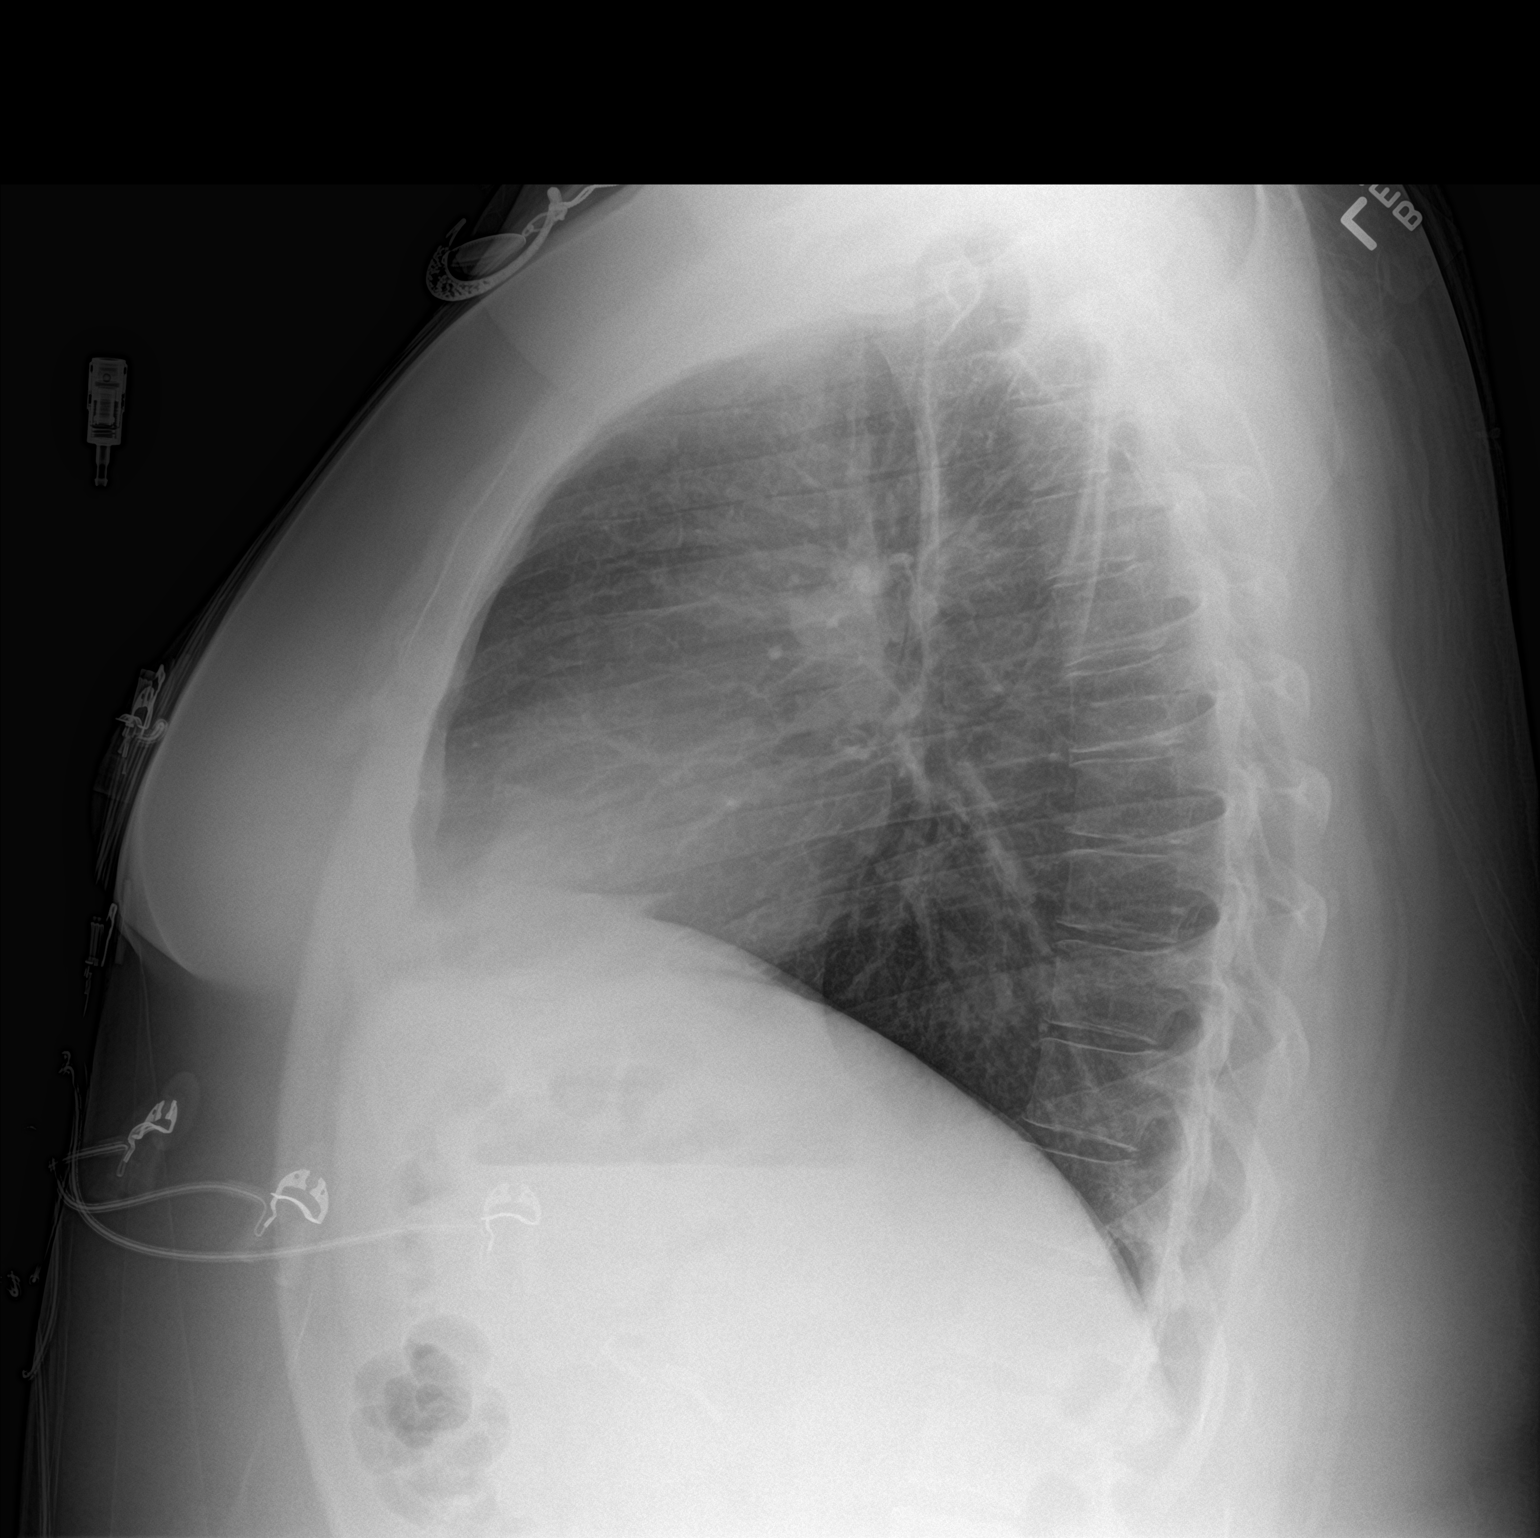

[2 of 2 positions shown; findings below may reference images not displayed]

FINDINGS: The heart size and mediastinal contours are within normal limits.
Both lungs are clear. The visualized skeletal structures are
unremarkable.
IMPRESSION: No active cardiopulmonary disease.

## 2016-11-24 IMAGING — DX DG LUMBAR SPINE COMPLETE 4+V
5 series · 5 of 5 positions shown · non-contrast
Comparison: CT abdomen/ pelvis 07/22/2016

CLINICAL DATA: Lumbosacral back pain, mid and lower. Bilateral leg
weakness.

EXAM:
LUMBAR SPINE - COMPLETE 4+ VIEW

[l-spine ap]
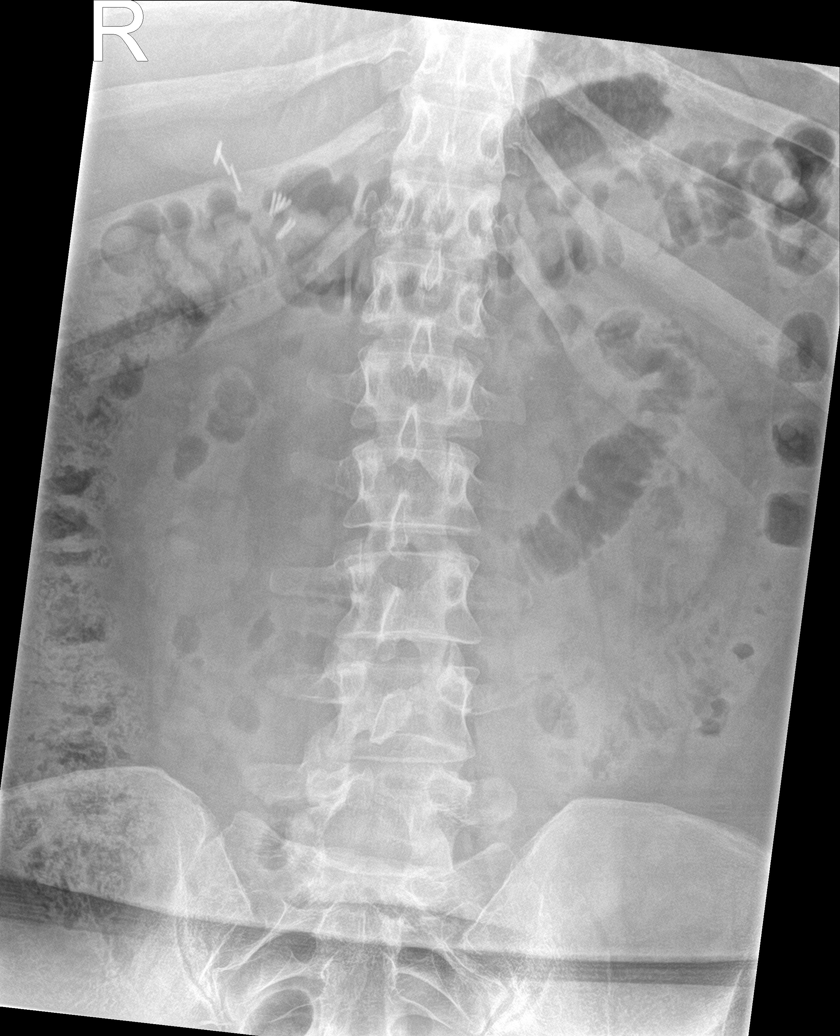

[l-spine obl (1 of 2)]
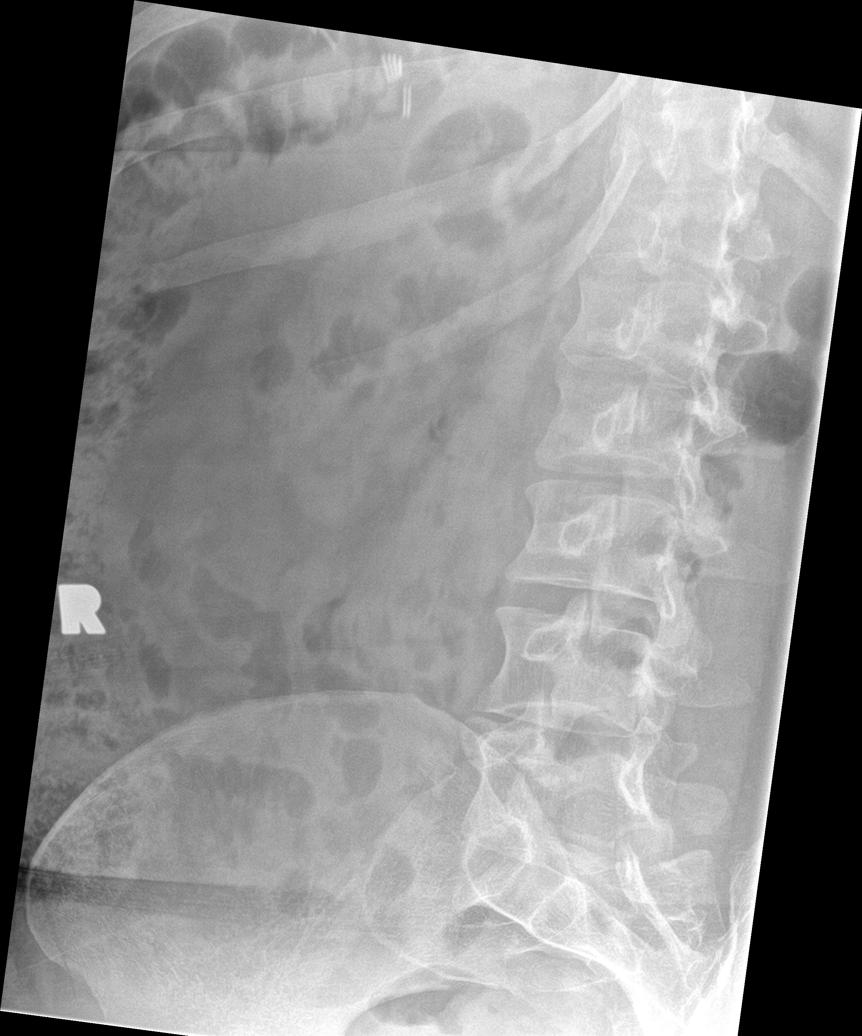

[l-spine obl (2 of 2)]
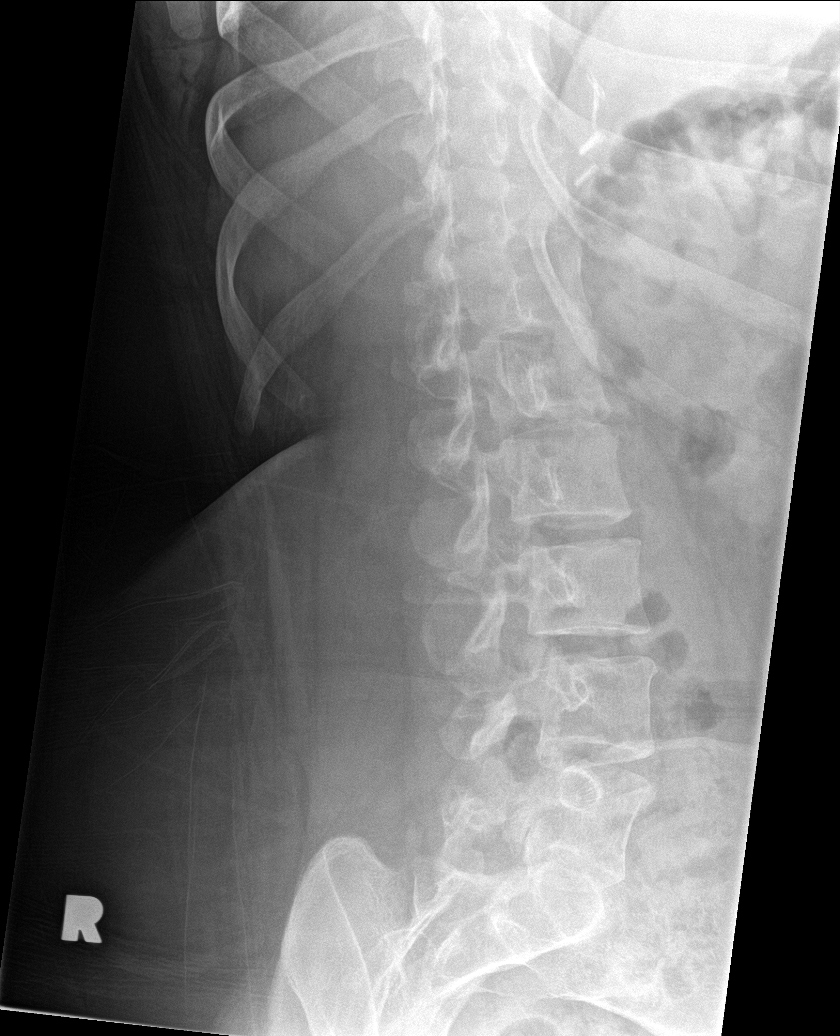

[l-spine lat]
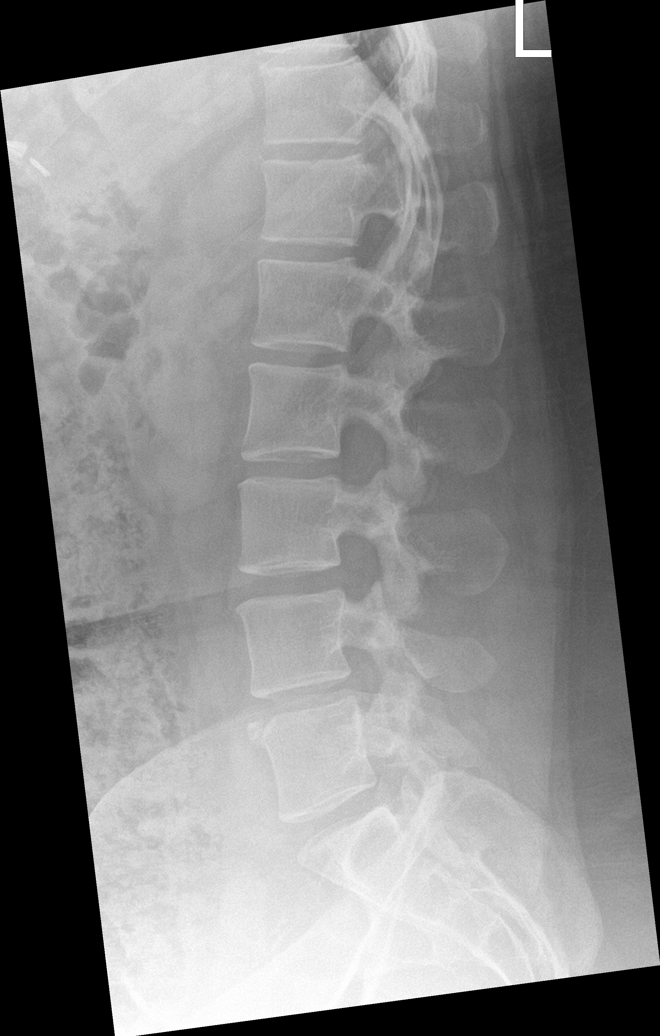

[l-spine spot]
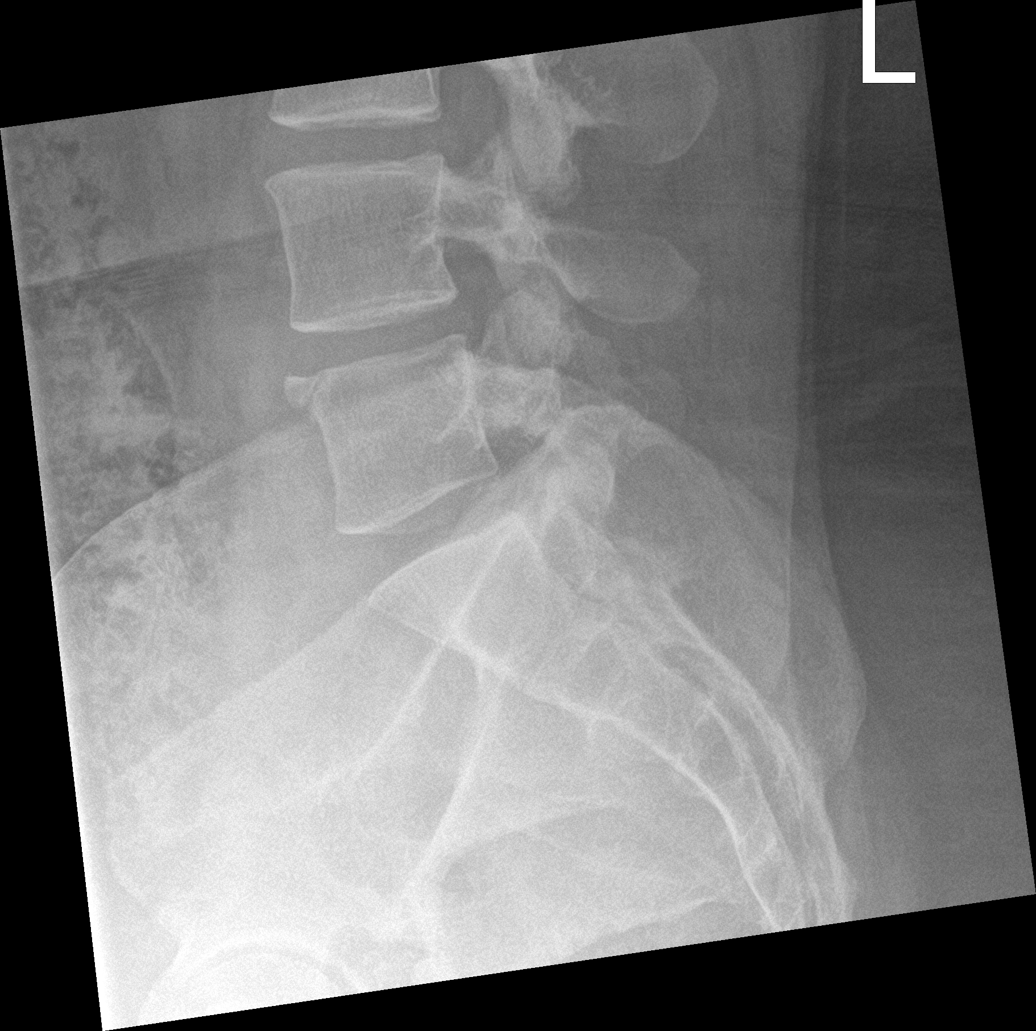

[5 of 5 positions shown; findings below may reference images not displayed]

FINDINGS: There are 6 non-rib-bearing lumbar vertebra. Alignment is
maintained. Limbic vertebra at L5. Unilateral left L5 pars defect,
better appreciated on CT. No listhesis. Normal vertebral body
heights. No fracture or focal osseous lesion.
IMPRESSION: 1. No acute bony abnormality. Incidental note of 6 non-rib-bearing
lumbar vertebra and limbic vertebra at L5.
2. The fat density intradural mass at L2-L3 level on recent CT is
not a structure visualized radiographically.

## 2016-12-19 ENCOUNTER — Telehealth (INDEPENDENT_AMBULATORY_CARE_PROVIDER_SITE_OTHER): Payer: Self-pay | Admitting: Internal Medicine

## 2016-12-19 NOTE — Telephone Encounter (Signed)
Patient called, stated that Dr. Karilyn Cotaehman told her when she got down to her last couple of doses of Lorazepam to call for a refill.  She's down to her last couple of doses.  [phone]

## 2016-12-20 ENCOUNTER — Other Ambulatory Visit (INDEPENDENT_AMBULATORY_CARE_PROVIDER_SITE_OTHER): Payer: Self-pay | Admitting: Internal Medicine

## 2016-12-20 NOTE — Telephone Encounter (Signed)
Per Dr.Rehman this Rx may be written. Hannah Arerri Setzer, NP is writing the new Rx. Patient will be called when it is ready.

## 2016-12-20 NOTE — Telephone Encounter (Signed)
The prescription was written , the last one was  written in January. Patient will be called and told to come and pick it up.

## 2017-04-10 ENCOUNTER — Telehealth (INDEPENDENT_AMBULATORY_CARE_PROVIDER_SITE_OTHER): Payer: Self-pay | Admitting: Internal Medicine

## 2017-04-10 NOTE — Telephone Encounter (Signed)
Patient called, stated that she is having back surgery the exact same day she was scheduled to come in the office, 05/08/17.  She stated that the surgeon told her she may not have all her full mobility back for several months.  She rescheduled her follow up with Dr. Karilyn Cotaehman for 08/14/17 at 3:30pm.  She wanted Dr. Karilyn Cotaehman to know and also wants to make sure that she will still be able to get her medications during this period of time.  [phone]

## 2017-04-11 DIAGNOSIS — M545 Low back pain, unspecified: Secondary | ICD-10-CM | POA: Insufficient documentation

## 2017-04-11 DIAGNOSIS — G8929 Other chronic pain: Secondary | ICD-10-CM | POA: Insufficient documentation

## 2017-04-12 NOTE — Telephone Encounter (Signed)
To be discussed with Dr.Reman.

## 2017-05-08 ENCOUNTER — Ambulatory Visit (INDEPENDENT_AMBULATORY_CARE_PROVIDER_SITE_OTHER): Payer: BLUE CROSS/BLUE SHIELD | Admitting: Internal Medicine

## 2017-05-16 HISTORY — PX: SPINAL CORD UNTETHERING: SHX2425

## 2017-08-14 ENCOUNTER — Encounter (INDEPENDENT_AMBULATORY_CARE_PROVIDER_SITE_OTHER): Payer: Self-pay | Admitting: Internal Medicine

## 2017-08-14 ENCOUNTER — Ambulatory Visit (INDEPENDENT_AMBULATORY_CARE_PROVIDER_SITE_OTHER): Payer: Self-pay | Admitting: Internal Medicine

## 2017-08-14 VITALS — BP 142/86 | HR 78 | Temp 97.7°F | Resp 18 | Ht 67.0 in | Wt 289.6 lb

## 2017-08-14 DIAGNOSIS — K219 Gastro-esophageal reflux disease without esophagitis: Secondary | ICD-10-CM

## 2017-08-14 DIAGNOSIS — F411 Generalized anxiety disorder: Secondary | ICD-10-CM

## 2017-08-14 DIAGNOSIS — K582 Mixed irritable bowel syndrome: Secondary | ICD-10-CM

## 2017-08-14 MED ORDER — LORAZEPAM 0.5 MG PO TABS: 0.5000 mg | ORAL_TABLET | Freq: Two times a day (BID) | ORAL | 2 refills | Status: DC | PRN

## 2017-08-14 MED ORDER — PANTOPRAZOLE SODIUM 40 MG PO TBEC: 40.0000 mg | DELAYED_RELEASE_TABLET | Freq: Every day | ORAL | 3 refills | Status: DC

## 2017-08-14 MED ORDER — DICYCLOMINE HCL 10 MG PO CAPS: 10.0000 mg | ORAL_CAPSULE | Freq: Three times a day (TID) | ORAL | 2 refills | Status: DC | PRN

## 2017-08-14 NOTE — Patient Instructions (Signed)
Gradually increase physical activity as tolerated. Weight check in 3 months.

## 2017-08-14 NOTE — Progress Notes (Signed)
Presenting complaint;  Follow-up for GERD IBS and anxiety.  Subjective:  Patient is 31 year old Caucasian female who is here for scheduled visit accompanied by her mother. She had spine surgery at [facility] in August of this year.  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  This was partially excised.  She states she was scared to lose function.  She now has a residual left perennial numbness.  Her pain is decreased.  She says she lost 17 pounds after surgery.  Now she is gaining.  She has gained 11 pounds since her last visit of January 2018.  After surgery she developed constipation and was taking 2 tablets of Senokot twice daily.  She then developed diarrhea with incontinence and she stopped it.  Now she is constipated again.  She denies melena or rectal bleeding.  She remains with intermittent pain on the left side of her abdomen.  She says she is prone to constipation.  It started about 4 years ago.  She does not have an insurance.  She is hoping that she will have one within the next month.  She says she shunts crowds.  She wonders if she has developed agoraphobia.  She is hoping to see therapist as soon as she has an Community education officerinsurance.  She needs her medications refilled.   Current Medications: Outpatient Encounter Prescriptions as of 08/14/2017  Medication Sig  . clonazePAM (KLONOPIN) 1 MG tablet Take 1 mg by mouth at bedtime.   . cyclobenzaprine (FLEXERIL) 10 MG tablet Take 10 mg by mouth at bedtime.   . dicyclomine (BENTYL) 10 MG capsule Take 1 capsule (10 mg total) by mouth 3 (three) times daily as needed for spasms.  Marland Kitchen. FLUoxetine (PROZAC) 20 MG tablet Take 20 mg by mouth daily.  Marland Kitchen. LORazepam (ATIVAN) 0.5 MG tablet TAKE ONE TABLET BY MOUTH 2 TIMES A DAY AS NEEDED.  . Multiple Vitamin (MULTIVITAMIN WITH MINERALS) TABS tablet Take 1 tablet by mouth daily.  . pantoprazole (PROTONIX) 40 MG tablet Take 1 tablet (40 mg total) by mouth daily.  . Wheat Dextrin (BENEFIBER DRINK MIX)  PACK Take 4 g by mouth at bedtime. (Patient not taking: Reported on 08/14/2017)   No facility-administered encounter medications on file as of 08/14/2017.      Objective: Blood pressure (!) 142/86, pulse 78, temperature 97.7 F (36.5 C), temperature source Oral, resp. rate 18, height 5\' 7"  (1.702 m), weight 289 lb 9.6 oz (131.4 kg). Patient is alert and in no acute distress. She has facial acne. Conjunctiva is pink. Sclera is nonicteric Oropharyngeal mucosa is normal. No neck masses or thyromegaly noted. Cardiac exam with regular rhythm normal S1 and S2. No murmur or gallop noted. Lungs are clear to auscultation. Abdomen is obese.  It is soft with mild tenderness below the left costal margin.  No organomegaly or masses. No LE edema or clubbing noted.    Assessment:  #1. GERD.  She is doing well with therapy.  No plans to change therapy at this time.  #2.  Irritable bowel syndrome.  Since she may have sphincter dysfunction secondary to nerve injury she needs to keep her stools soft in order to prevent seepage.  #3.  Anxiety state.  She will benefit from consultation with therapist or psychologist.   Plan:  New prescription given for dicyclomine, pantoprazole and lorazepam. Increase intake of fiber rich foods. Patient advised to increase physical activity as tolerated in order to lose weight. Office visit in 6 months.

## 2017-11-02 ENCOUNTER — Ambulatory Visit: Payer: Medicaid Other | Admitting: Family Medicine

## 2017-11-20 ENCOUNTER — Other Ambulatory Visit: Payer: Self-pay

## 2017-11-20 ENCOUNTER — Encounter: Payer: Self-pay | Admitting: Family Medicine

## 2017-11-20 ENCOUNTER — Ambulatory Visit (INDEPENDENT_AMBULATORY_CARE_PROVIDER_SITE_OTHER): Payer: BLUE CROSS/BLUE SHIELD | Admitting: Family Medicine

## 2017-11-20 VITALS — BP 138/96 | HR 112 | Temp 98.7°F | Resp 18 | Ht 67.0 in | Wt 296.0 lb

## 2017-11-20 DIAGNOSIS — M545 Low back pain, unspecified: Secondary | ICD-10-CM

## 2017-11-20 DIAGNOSIS — L700 Acne vulgaris: Secondary | ICD-10-CM | POA: Diagnosis not present

## 2017-11-20 DIAGNOSIS — F419 Anxiety disorder, unspecified: Secondary | ICD-10-CM | POA: Diagnosis not present

## 2017-11-20 DIAGNOSIS — G8929 Other chronic pain: Secondary | ICD-10-CM | POA: Diagnosis not present

## 2017-11-20 DIAGNOSIS — K582 Mixed irritable bowel syndrome: Secondary | ICD-10-CM

## 2017-11-20 DIAGNOSIS — K219 Gastro-esophageal reflux disease without esophagitis: Secondary | ICD-10-CM

## 2017-11-20 DIAGNOSIS — F329 Major depressive disorder, single episode, unspecified: Secondary | ICD-10-CM | POA: Diagnosis not present

## 2017-11-20 DIAGNOSIS — K589 Irritable bowel syndrome without diarrhea: Secondary | ICD-10-CM | POA: Insufficient documentation

## 2017-11-20 MED ORDER — FLUOXETINE HCL 20 MG PO TABS: 20.0000 mg | ORAL_TABLET | Freq: Every day | ORAL | 3 refills | Status: DC

## 2017-11-20 NOTE — Progress Notes (Signed)
Chief Complaint  Patient presents with  . Tachycardia    est care   Patient is here to establish care. She is under the care of Dr. Rehman for irritable bowel syndrome and chronic GERD.  He gets her BeKarilyn Cotantyl to take as needed.  She also takes pantoprazole daily.  She is on a fiber supplement.  He also has been refilling her Ativan.  His note indicates that it is because she did not have insurance and did not have a Veterinary surgeoncounselor.  I have indicated to CusterBrooke that it is not appropriate to get Ativan on an ongoing basis from her gastroenterologist.  I will refer her to psychiatry.  She states that she has chronic anxiety and panic attacks.  She previously took fluoxetine.  She ran out.  I offered to refill her fluoxetine today.  I explained her that this is a better choice for anxiety and Ativan. The patient takes Neurontin for pain.  She had back surgery in August of last year.  She states she still has a lot of pain.  She had physical therapy for a month.  She is not doing his exercises.  She is not walking or engaging in exercise at all right now.  I discussed with her that her pain could not be expected to improve if she did not comply with her spine surgeons recommendations to lose weight and exercise. She is morbidly obese.  Almost 300 pounds.  She states she has "tried everything" to lose weight.  She is not interested in a bariatric surgery consultation.  I explained to her that we could try to get her in with the bariatric medical specialist. She is to go to the [facility] for primary care.  She states her Pap is up-to-date.  She states she has had blood work this year.  She states her immunizations are not up-to-date, she refuses flu shot or tetanus update.  She thinks she got all of her childhood immunizations on time.   Patient Active Problem List   Diagnosis Date Noted  . IBS (irritable bowel syndrome) 11/20/2017  . Chronic GERD 11/20/2017  . Morbid obesity (HCC) 11/20/2017  . Chronic  low back pain 04/11/2017  . Depression with anxiety 12/24/2015    Outpatient Encounter Medications as of 11/20/2017  Medication Sig  . clonazePAM (KLONOPIN) 1 MG tablet Take 1 mg by mouth at bedtime.   . cyclobenzaprine (FLEXERIL) 10 MG tablet Take 10 mg by mouth at bedtime.   . dicyclomine (BENTYL) 10 MG capsule Take 1 capsule (10 mg total) by mouth 3 (three) times daily as needed for spasms.  Marland Kitchen. gabapentin (NEURONTIN) 300 MG capsule Take by mouth.  Marland Kitchen. LORazepam (ATIVAN) 0.5 MG tablet Take 1 tablet (0.5 mg total) by mouth 2 (two) times daily as needed for anxiety.  . Multiple Vitamin (MULTIVITAMIN WITH MINERALS) TABS tablet Take 1 tablet by mouth daily.  . pantoprazole (PROTONIX) 40 MG tablet Take 1 tablet (40 mg total) by mouth daily.  . Wheat Dextrin (BENEFIBER DRINK MIX) PACK Take 4 g by mouth at bedtime.  . [DISCONTINUED] FLUoxetine (PROZAC) 20 MG tablet Take 20 mg by mouth daily.  Marland Kitchen. FLUoxetine (PROZAC) 20 MG tablet Take 1 tablet (20 mg total) by mouth daily.  . Norgestimate-Ethinyl Estradiol Triphasic 0.18/0.215/0.25 MG-25 MCG tab Take by mouth.   No facility-administered encounter medications on file as of 11/20/2017.     Past Medical History:  Diagnosis Date  . Anxiety   . Asthma  as a child  . Depression   . Family history of adverse reaction to anesthesia    post op nausea  . GERD (gastroesophageal reflux disease)   . Spina bifida occulta   . Tethered cord (HCC) 08/22/2016    Past Surgical History:  Procedure Laterality Date  . CHOLECYSTECTOMY N/A 12/29/2015   Procedure: LAPAROSCOPIC CHOLECYSTECTOMY WITH INTRAOPERATIVE CHOLANGIOGRAM;  Surgeon: Franky Macho, MD;  Location: [facility];  Service: General;  Laterality: N/A;  . DIAGNOSTIC LAPAROSCOPIC LIVER BIOPSY  12/29/2015   Procedure: LAPAROSCOPIC LIVER BIOPSY;  Surgeon: Franky Macho, MD;  Location: [facility];  Service: General;;  . SPINAL CORD UNTETHERING  05/16/2017  . SPINE SURGERY      Social History   Socioeconomic  History  . Marital status: Significant Other    Spouse name: curtis  . Number of children: 0  . Years of education: 66  . Highest education level: Associate degree: academic program  Social Needs  . Financial resource strain: Not hard at all  . Food insecurity - worry: Not on file  . Food insecurity - inability: Not on file  . Transportation needs - medical: Not on file  . Transportation needs - non-medical: Not on file  Occupational History  . Occupation: unemployed    Comment: courier  Tobacco Use  . Smoking status: Former Smoker    Types: Cigarettes    Last attempt to quit: 07/17/2015    Years since quitting: 2.3  . Smokeless tobacco: Never Used  . Tobacco comment: 1/4 ppd  Substance and Sexual Activity  . Alcohol use: Yes    Alcohol/week: 0.0 oz    Comment: occasional  . Drug use: No  . Sexual activity: Yes    Birth control/protection: None  Other Topics Concern  . Not on file  Social History Narrative   Prior work as a Heritage manager   Now unemployed   Lives with mom and BF Curtis   Leisure- "not a lot"    Family History  Problem Relation Age of Onset  . Depression Mother   . Diabetes Mother   . Hypertension Father   . Diabetes Father   . Kidney disease Father   . Hypertension Maternal Grandmother   . Mental illness Maternal Grandmother   . Mental illness Paternal Grandmother   . Heart disease Paternal Grandfather     Review of Systems  Constitutional: Negative for chills, fever and weight loss.  HENT: Negative for congestion and hearing loss.   Eyes: Negative for blurred vision and pain.  Respiratory: Negative for cough and shortness of breath.   Cardiovascular: Negative for chest pain and leg swelling.  Gastrointestinal: Positive for abdominal pain. Negative for constipation, diarrhea and heartburn.  Genitourinary: Negative for dysuria and frequency.  Musculoskeletal: Positive for back pain. Negative for falls, joint pain and myalgias.  Neurological:  Negative for dizziness, seizures and headaches.  Psychiatric/Behavioral: Negative for depression. The patient is nervous/anxious. The patient does not have insomnia.     BP (!) 138/96   Pulse (!) 112   Temp 98.7 F (37.1 C) (Temporal)   Resp 18   Ht 5\' 7"  (1.702 m)   Wt 296 lb 0.6 oz (134.3 kg)   LMP 11/17/2017 (Exact Date)   SpO2 98%   BMI 46.37 kg/m   Physical Exam  Constitutional: She is oriented to person, place, and time. She appears well-developed and well-nourished.  HENT:  Head: Normocephalic and atraumatic.  Mouth/Throat: Oropharynx is clear and moist.  Eyes: Conjunctivae are  normal. Pupils are equal, round, and reactive to light.  Neck: Normal range of motion. Neck supple. No thyromegaly present.  Cardiovascular: Normal rate, regular rhythm and normal heart sounds.  Pulmonary/Chest: Effort normal and breath sounds normal. No respiratory distress.  Abdominal: Soft. Bowel sounds are normal.  Musculoskeletal: Normal range of motion. She exhibits no edema.  Lymphadenopathy:    She has no cervical adenopathy.  Neurological: She is alert and oriented to person, place, and time.  Gait normal  Skin: Skin is warm and dry. Rash noted.  Prominent acne vulgaris of face  Psychiatric: She has a normal mood and affect. Her behavior is normal. Thought content normal.  Nursing note and vitals reviewed.  ASSESSMENT/PLAN:  1. Irritable bowel syndrome with both constipation and diarrhea Under care of Dr. Dionicia Abler  2. Chronic GERD Stable on pantoprazole  3. Morbid obesity (HCC) Discussed diet and exercise.  Patient drinks coke and sweet tea.  She is advised to stop this immediately and switch to water, flavored water, or seltzers.  We discussed reducing carbohydrates and portions.  We discussed avoiding snacking.  Discussed also the importance of daily walking or exercise.  4. Chronic bilateral low back pain without sciatica Status post decompression surgery  5. Acne  vulgaris Untreated  6. Anxiety and depression Refill fluoxetine.  Xanax as needed.  Refer to psychiatry for ongoing management. - Ambulatory referral to Psychiatry   Patient Instructions  Go back on the fluoxetine Take one a day Walk every day that you are able Let me now if you want to see medical specialist for your weight I have placed a referral for psychiatry  See me in 3 months   Eustace Moore, MD

## 2017-11-20 NOTE — Patient Instructions (Addendum)
Go back on the fluoxetine Take one a day Walk every day that you are able Let me now if you want to see medical specialist for your weight I have placed a referral for psychiatry  See me in 3 months

## 2017-11-29 ENCOUNTER — Telehealth: Payer: Self-pay

## 2017-11-29 NOTE — Telephone Encounter (Signed)
[facility] - left message.  

## 2017-11-29 NOTE — Telephone Encounter (Signed)
Reviewing PHQ-9 scores

## 2017-12-24 ENCOUNTER — Encounter: Payer: Self-pay | Admitting: Family Medicine

## 2017-12-25 ENCOUNTER — Ambulatory Visit (INDEPENDENT_AMBULATORY_CARE_PROVIDER_SITE_OTHER): Payer: BLUE CROSS/BLUE SHIELD | Admitting: Psychiatry

## 2017-12-25 ENCOUNTER — Encounter (HOSPITAL_COMMUNITY): Payer: Self-pay | Admitting: Psychiatry

## 2017-12-25 VITALS — BP 126/72 | HR 110 | Ht 67.0 in | Wt 301.0 lb

## 2017-12-25 DIAGNOSIS — F411 Generalized anxiety disorder: Secondary | ICD-10-CM | POA: Diagnosis not present

## 2017-12-25 DIAGNOSIS — Z560 Unemployment, unspecified: Secondary | ICD-10-CM | POA: Diagnosis not present

## 2017-12-25 DIAGNOSIS — F1310 Sedative, hypnotic or anxiolytic abuse, uncomplicated: Secondary | ICD-10-CM

## 2017-12-25 DIAGNOSIS — Z9114 Patient's other noncompliance with medication regimen: Secondary | ICD-10-CM | POA: Diagnosis not present

## 2017-12-25 DIAGNOSIS — F4010 Social phobia, unspecified: Secondary | ICD-10-CM

## 2017-12-25 DIAGNOSIS — Z818 Family history of other mental and behavioral disorders: Secondary | ICD-10-CM

## 2017-12-25 DIAGNOSIS — Z87891 Personal history of nicotine dependence: Secondary | ICD-10-CM

## 2017-12-25 DIAGNOSIS — F410 Panic disorder [episodic paroxysmal anxiety] without agoraphobia: Secondary | ICD-10-CM

## 2017-12-25 NOTE — Progress Notes (Signed)
Psychiatric Initial Adult Assessment   Patient Identification: Hannah Lambert MRN:  [id number] Date of Evaluation:  12/25/2017 Referral Source: Self-referred/primary care physician Chief Complaint:  I need Klonopin for my panic attack.  Visit Diagnosis:    ICD-10-CM   1. GAD (generalized anxiety disorder) F41.1   2. Benzodiazepine abuse (HCC) F13.10     History of Present Illness: Patient is 32 year old Caucasian, unemployed single female who is referred from primary care physician for the management of her mental illness.  Patient reported long history of depression and anxiety attacks.  She has prescribed medication since age 89.  In the past she has prescribed Wellbutrin which she stopped after taking a few months then Zoloft for a few years and then Prozac which she took since age 80.  Due to losing her insurance she has been noncompliant with her Prozac however recently her primary care physician resumed Prozac 20 mg.  Patient told that she had tried Xanax 0.5 mg 5 times a day but it was switched to Valium 5 mg 3 times a day and then her medications were stopped at [place] because psychiatrist believe that she does not have any anxiety disorder.  The past few years she has been struggling with her health issues.  She has back surgery at [facility] for lipoma.  She has multiple health issues and she has been seeing multiple doctors including neurology, GI.  She reported back pain, chronic numbness, weird body sensation.  In 2017 she is been ER 8 times because of the symptoms and finally she had CT scan and MRI and diagnosed with lipoma.  After the surgery her symptoms most of them resolved but she continued to have these neurological symptoms.  She endorsed having panic attacks which she described difficulty breathing with increased heart rate.  She admitted taking Klonopin from the family member for past 2 years and she endorsed that the only medicine that helps her sleep and anxiety attacks.  She  also taking gabapentin 300 mg for neuropathy pain.  She admitted her sleep pattern is weird sometimes she sleeps too much during the day and sometimes sleeps too much at night.  Patient does not work.  She apply for disability but it was denied.  She lives with her boyfriend and her mother 2 years ago moved in because she has no other place to go.  Patient denies any paranoia, hallucination, suicidal thoughts or homicidal thought.  She admitted fear of leaving the house due to anxiety and she gets very anxious and nervous in social situation.  Recently her best friend was murdered 1 week ago due to road rage and loss October another friend found her dead while coming back from [place]  Patient believe it could be drug overdose.  Patient appears emotional with decreased energy and complaining of racing thoughts.  She started taking Prozac few weeks ago.  She is taking 20 mg.  She also taking Ativan 0.5 mg for panic attack prescribed by Dr. Karilyn Cota. She is currently not in any therapy.  She does not want to try any other medication and insists that she should be given Klonopin.  Patient admitted smoking marijuana on and off but denies any other drug use.  Patient denies any suicidal thoughts or homicidal thought.  She denies any paranoia, psychosis or any hallucination.  Patient has obesity, IBS, chronic back pain, numbness and tingling.  She supposed to lose weight and physical therapy but she is been noncompliant with a neurologist recommendation.  Associated  Signs/Symptoms: Depression Symptoms:  depressed mood, insomnia, fatigue, difficulty concentrating, anxiety, disturbed sleep, (Hypo) Manic Symptoms:  Irritable Mood, Anxiety Symptoms:  Panic Symptoms, Social Anxiety, Psychotic Symptoms:  No psychotic symptoms. PTSD Symptoms: Negative  Past Psychiatric History: Patient is started taking psychotropic medication since age 63.  It was prescribed by Dr. Arma Heading her primary care physician yet she  tried initially Wellbutrin which she took for a few months but stopped due to side effects.  She was prescribed Zoloft which she took for a few years but that it was switched to Prozac.  She was also given BuSpar but she did not like the side effects.  She saw psychiatrist at [facility] in [place].  She saw multiple doctors there and she was given initially Xanax but later her benzo was switched from Xanax 0.5 mg 5 times a day to Valium 5 mg 3 times a day and eventually it was stopped.  Patient told Dr. Nedra Hai at [facility] did not agree that I have anxiety disorder and he discontinued all the medication.  Patient recently resumed taking Prozac 20 mg from primary care physician.  She denies any history of suicidal attempt, paranoia, hallucination, psychiatric inpatient treatment.  Patient denies any history of abuse.    Previous Psychotropic Medications: No   Substance Abuse History in the last 12 months:  Yes.    Consequences of Substance Abuse: Patient smokes marijuana on and off.  Past Medical History:  Past Medical History:  Diagnosis Date  . Anxiety   . Asthma    as a child  . Depression   . Family history of adverse reaction to anesthesia    post op nausea  . GERD (gastroesophageal reflux disease)   . Spina bifida occulta   . Tethered cord (HCC) 08/22/2016    Past Surgical History:  Procedure Laterality Date  . CHOLECYSTECTOMY N/A 12/29/2015   Procedure: LAPAROSCOPIC CHOLECYSTECTOMY WITH INTRAOPERATIVE CHOLANGIOGRAM;  Surgeon: Franky Macho, MD;  Location: [facility];  Service: General;  Laterality: N/A;  . DIAGNOSTIC LAPAROSCOPIC LIVER BIOPSY  12/29/2015   Procedure: LAPAROSCOPIC LIVER BIOPSY;  Surgeon: Franky Macho, MD;  Location: [facility];  Service: General;;  . SPINAL CORD UNTETHERING  05/16/2017  . SPINE SURGERY      Family Psychiatric History: Patient reported grandmother has bipolar disorder.  Family History:  Family History  Problem Relation Age of Onset  . Depression Mother   .  Diabetes Mother   . Hypertension Father   . Diabetes Father   . Kidney disease Father   . Hypertension Maternal Grandmother   . Mental illness Maternal Grandmother   . Mental illness Paternal Grandmother   . Heart disease Paternal Grandfather     Social History:   Social History   Socioeconomic History  . Marital status: Significant Other    Spouse name: curtis  . Number of children: 0  . Years of education: 11  . Highest education level: Associate degree: academic program  Social Needs  . Financial resource strain: Not hard at all  . Food insecurity - worry: None  . Food insecurity - inability: None  . Transportation needs - medical: None  . Transportation needs - non-medical: None  Occupational History  . Occupation: unemployed    Comment: courier  Tobacco Use  . Smoking status: Former Smoker    Types: Cigarettes    Last attempt to quit: 07/17/2015    Years since quitting: 2.4  . Smokeless tobacco: Never Used  . Tobacco comment: 1/4 ppd  Substance and Sexual Activity  . Alcohol use: Yes    Alcohol/week: 0.0 oz    Comment: occasional  . Drug use: No  . Sexual activity: Yes    Birth control/protection: None  Other Topics Concern  . None  Social History Narrative   Prior work as a [employer] manager   Now unemployed   Lives with mom and BF Lyda Jester   Leisure- "not a lot"    Additional Social History: Patient born and raised in [place].  Her parents divorced when she was 27 years old.  Her father lives in [place].  Her mother moved in 2 years ago.  She lives with her boyfriend.  She apply for disability but it was denied.  Patient has no children.    Allergies:  No Known Allergies  Metabolic Disorder Labs: Lab Results  Component Value Date   HGBA1C 5.4 12/24/2015   MPG 108 12/24/2015   No results found for: PROLACTIN No results found for: CHOL, TRIG, HDL, CHOLHDL, VLDL, LDLCALC   Current Medications: Current Outpatient Medications  Medication Sig  Dispense Refill  . clonazePAM (KLONOPIN) 1 MG tablet Take 1 mg by mouth at bedtime.     . cyclobenzaprine (FLEXERIL) 10 MG tablet Take 10 mg by mouth at bedtime.     . dicyclomine (BENTYL) 10 MG capsule Take 1 capsule (10 mg total) by mouth 3 (three) times daily as needed for spasms. 90 capsule 2  . FLUoxetine (PROZAC) 20 MG tablet Take 1 tablet (20 mg total) by mouth daily. 30 tablet 3  . gabapentin (NEURONTIN) 300 MG capsule Take by mouth.    Marland Kitchen LORazepam (ATIVAN) 0.5 MG tablet Take 1 tablet (0.5 mg total) by mouth 2 (two) times daily as needed for anxiety. 60 tablet 2  . Multiple Vitamin (MULTIVITAMIN WITH MINERALS) TABS tablet Take 1 tablet by mouth daily.    . Norgestimate-Ethinyl Estradiol Triphasic 0.18/0.215/0.25 MG-25 MCG tab Take by mouth.    . pantoprazole (PROTONIX) 40 MG tablet Take 1 tablet (40 mg total) by mouth daily. 90 tablet 3  . Wheat Dextrin (BENEFIBER DRINK MIX) PACK Take 4 g by mouth at bedtime.     No current facility-administered medications for this visit.     Neurologic: Headache: Yes Seizure: No Paresthesias:Tingling and numbness  Musculoskeletal: Strength & Muscle Tone: within normal limits Gait & Station: normal Patient leans: N/A  Psychiatric Specialty Exam: ROS  Blood pressure 126/72, pulse (!) 110, height 5\' 7"  (1.702 m), weight (!) 301 lb (136.5 kg).Body mass index is 47.14 kg/m.  General Appearance: Casual  Eye Contact:  Fair  Speech:  Clear and Coherent  Volume:  Normal  Mood:  Anxious  Affect:  Appropriate  Thought Process:  Goal Directed  Orientation:  Full (Time, Place, and Person)  Thought Content:  Rumination  Suicidal Thoughts:  No  Homicidal Thoughts:  No  Memory:  Immediate;   Good Recent;   Good Remote;   Good  Judgement:  Fair  Insight:  Fair  Psychomotor Activity:  Normal  Concentration:  Concentration: Fair and Attention Span: Fair  Recall:  Good  Fund of Knowledge:Good  Language: Good  Akathisia:  No  Handed:  Right   AIMS (if indicated):  0  Assets:  Communication Skills Housing Social Support  ADL's:  Intact  Cognition: WNL  Sleep: Fair   Assessment: Generalized anxiety disorder.  Panic attacks.  Plan: I review her symptoms, psychosocial stressors, current medication, collateral information from other providers including primary care  doctor and recent blood work results.  Patient insists that she want Klonopin.  She is getting Klonopin from her family member 1 mg every night.  We discussed that it is illegal to have medication from the family member.  I also encouraged she should see therapist for CBT.  She is getting Ativan 0.5 mg from her GI for panic attacks.  She just started taking Prozac 20 mg.  We discussed non-benzodiazepine medication management for anxiety but patient does not want to try a different medication.  We discussed option of Effexor, Cymbalta, increasing Prozac but she refused changing her medication.  However she agreed to see a therapist in [place] for CBT.  We also discussed cannabis use and patient promised that she will work on it to stop it.  Patient was not happy when she was told that no benzodiazepine will be given.  However she agreed to see a psychiatrist in [place].  Patient lives in [place] and she like to get appointment to see a therapist and psychiatrist in [place].  Discussed safety concerns at any time having active suicidal thoughts or homicidal thought that she need to call 911 or go to local emergency room.  Follow-up in [place] office.  Cleotis NipperSyed T Arfeen, MD 3/12/201910:14 AM

## 2018-01-15 ENCOUNTER — Encounter: Payer: Self-pay | Admitting: Family Medicine

## 2018-01-15 ENCOUNTER — Telehealth: Payer: Self-pay | Admitting: Family Medicine

## 2018-01-15 ENCOUNTER — Other Ambulatory Visit: Payer: Self-pay

## 2018-01-15 DIAGNOSIS — F1310 Sedative, hypnotic or anxiolytic abuse, uncomplicated: Secondary | ICD-10-CM

## 2018-01-15 HISTORY — DX: Sedative, hypnotic or anxiolytic abuse, uncomplicated: F13.10

## 2018-01-15 MED ORDER — GABAPENTIN 300 MG PO CAPS: 600.0000 mg | ORAL_CAPSULE | Freq: Three times a day (TID) | ORAL | 0 refills | Status: AC

## 2018-01-15 MED ORDER — CYCLOBENZAPRINE HCL 10 MG PO TABS: 10.0000 mg | ORAL_TABLET | Freq: Two times a day (BID) | ORAL | 0 refills | Status: AC | PRN

## 2018-01-15 NOTE — Progress Notes (Signed)
BH MD/PA/NP OP Progress Note  01/18/2018 10:33 AM Hannah BoysBrooke N Rewerts  MRN:  [id number]  Chief Complaint:  Chief Complaint    Anxiety; Follow-up     HPI:  Hannah Lambert is a 32 y.o. year old female with a history of anxiety, marijuana use, irritable bowel syndrome. GERD, Spina bifida, intradural lipoma of s/p surgery, who is transferred from Dr. Lolly MustacheArfeen.   Patient states that she was very upset at the previous visit with psychiatrist. She states that she went through a lot; she went to ED for eight times for some cold sensation in her feet. She had two surgeries, one at [facility] and another for lipoma. She was very stressed as she could not walk and had decreased mobility after surgery. She still has some burning sensation and pressure in her spine. She occasionally has panic attacks, mostly in relate to her pain. Although she used to be [facility] and muscle pain (verified on care everywhere- clonazepam 1 mg qhs), she lost her insurance and her gastroenterologist has bene prescribed ativan as a "rescue." She gets clonazepam 1 mg qhs from her mother, last dose yesterday. She was recently started on fluoxetine by her PCP, and finds it helpful for her mood. She takes fluoxetine 40 mg every other day from her mother (when she lost her insurance) as she usually has worsening mood symptoms around menstrual cycle. She does not want to take many medication as it triggers her fear that her liver may be damaged. She finds her mother to be very supportive.   She has occasional insomnia. She feels fatigue and depressed at times, although it has been improving. She has fair concentration. She denies SI. She feels anxious, tense at times. She took ativan two weeks ago for panic attack. She rarely drinks alcohol as she is concerned about interaction with her medication. She uses marijuana a couple of times a week. She states that she continues to use it as her boyfriend at home uses;  although it used to help to relax her, it makes the spinal pressure worse. She reports that her mother had rash from duloxetine, citalopram   Per PMP,  Lorazepam 0.5 mg BID for 30 days, last filled on 12/25/2017, written 08/14/2017  I have utilized the [place] Controlled Substances Reporting System (PMP AWARxE) to confirm adherence regarding the patient's medication. My review reveals appropriate prescription fills.   Visit Diagnosis:    ICD-10-CM   1. GAD (generalized anxiety disorder) F41.1 POCT Urine Drug Screen    TSH    Drugs of abuse screen w/o alc (for BH OP)  2. MDD (major depressive disorder), recurrent episode, moderate (HCC) F33.1     Past Psychiatric History:  Outpatient: started antidepressant since age 32,  Psychiatry admission: denies Previous suicide attempt: denies  Past trials of medication: fluoxetine (since age 89), sertraline, buspar, Wellbutrin, valium History of violence: denies  Past Medical History:  Past Medical History:  Diagnosis Date  . Anxiety   . Asthma    as a child  . Benzodiazepine abuse (HCC) 01/15/2018  . Depression   . Family history of adverse reaction to anesthesia    post op nausea  . GERD (gastroesophageal reflux disease)   . Spina bifida occulta   . Tethered cord (HCC) 08/22/2016    Past Surgical History:  Procedure Laterality Date  . CHOLECYSTECTOMY N/A 12/29/2015   Procedure: LAPAROSCOPIC CHOLECYSTECTOMY WITH INTRAOPERATIVE CHOLANGIOGRAM;  Surgeon: Franky MachoMark Jenkins, MD;  Location: [facility];  Service: General;  Laterality: N/A;  . DIAGNOSTIC LAPAROSCOPIC LIVER BIOPSY  12/29/2015   Procedure: LAPAROSCOPIC LIVER BIOPSY;  Surgeon: Franky Macho, MD;  Location: [facility];  Service: General;;  . SPINAL CORD UNTETHERING  05/16/2017  . SPINE SURGERY      Family Psychiatric History:  I have reviewed the patient's family history in detail and updated the patient record.  Family History:  Family History  Problem Relation Age of Onset  . Depression  Mother   . Diabetes Mother   . Hypertension Father   . Diabetes Father   . Kidney disease Father   . Hypertension Maternal Grandmother   . Mental illness Maternal Grandmother   . Mental illness Paternal Grandmother   . Heart disease Paternal Grandfather     Social History:  Social History   Socioeconomic History  . Marital status: Significant Other    Spouse name: curtis  . Number of children: 0  . Years of education: 3  . Highest education level: Associate degree: academic program  Occupational History  . Occupation: unemployed    Comment: courier  Social Needs  . Financial resource strain: Not hard at all  . Food insecurity:    Worry: Not on file    Inability: Not on file  . Transportation needs:    Medical: Not on file    Non-medical: Not on file  Tobacco Use  . Smoking status: Former Smoker    Types: Cigarettes    Last attempt to quit: 07/17/2015    Years since quitting: 2.5  . Smokeless tobacco: Never Used  . Tobacco comment: 1/4 ppd  Substance and Sexual Activity  . Alcohol use: Yes    Alcohol/week: 0.0 oz    Comment: occasional  . Drug use: No  . Sexual activity: Yes    Birth control/protection: None  Lifestyle  . Physical activity:    Days per week: Not on file    Minutes per session: Not on file  . Stress: Not on file  Relationships  . Social connections:    Talks on phone: Not on file    Gets together: Not on file    Attends religious service: Not on file    Active member of club or organization: Not on file    Attends meetings of clubs or organizations: Not on file    Relationship status: Not on file  Other Topics Concern  . Not on file  Social History Narrative   Prior work as a [employer] manager   Now unemployed   Lives with mom and BF Lyda Jester   Leisure- "not a lot"    Allergies: No Known Allergies  Metabolic Disorder Labs: Lab Results  Component Value Date   HGBA1C 5.4 12/24/2015   MPG 108 12/24/2015   No results found for: PROLACTIN No  results found for: CHOL, TRIG, HDL, CHOLHDL, VLDL, LDLCALC Lab Results  Component Value Date   TSH 1.378 12/24/2015    Therapeutic Level Labs: No results found for: LITHIUM No results found for: VALPROATE No components found for:  CBMZ  Current Medications: Current Outpatient Medications  Medication Sig Dispense Refill  . clonazePAM (KLONOPIN) 1 MG tablet Take 1 tablet (1 mg total) by mouth at bedtime as needed for anxiety. 30 tablet 0  . cyclobenzaprine (FLEXERIL) 10 MG tablet Take 1 tablet (10 mg total) by mouth 2 (two) times daily as needed. 60 tablet 0  . dicyclomine (BENTYL) 10 MG capsule Take 1 capsule (10 mg total) by mouth 3 (three)  times daily as needed for spasms. 90 capsule 2  . FLUoxetine (PROZAC) 20 MG tablet Take 1 tablet (20 mg total) by mouth daily. 30 tablet 3  . gabapentin (NEURONTIN) 300 MG capsule Take 2 capsules (600 mg total) by mouth 3 (three) times daily. 180 capsule 0  . LORazepam (ATIVAN) 0.5 MG tablet Take 1 tablet (0.5 mg total) by mouth 2 (two) times daily as needed for anxiety. 60 tablet 2  . Multiple Vitamin (MULTIVITAMIN WITH MINERALS) TABS tablet Take 1 tablet by mouth daily.    . Norgestimate-Ethinyl Estradiol Triphasic 0.18/0.215/0.25 MG-25 MCG tab Take by mouth.    . pantoprazole (PROTONIX) 40 MG tablet Take 1 tablet (40 mg total) by mouth daily. 90 tablet 3  . Wheat Dextrin (BENEFIBER DRINK MIX) PACK Take 4 g by mouth at bedtime.     No current facility-administered medications for this visit.      Musculoskeletal: Strength & Muscle Tone: within normal limits Gait & Station: normal Patient leans: N/A   Psychiatric Specialty Exam: Review of Systems  Musculoskeletal: Positive for back pain.  Neurological: Positive for sensory change.  Psychiatric/Behavioral: Positive for depression. Negative for hallucinations, memory loss, substance abuse and suicidal ideas. The patient is nervous/anxious and has insomnia.   All other systems reviewed and are  negative.   Blood pressure (!) 157/106, pulse (!) 109, height 5\' 7"  (1.702 m), weight (!) 301 lb (136.5 kg), SpO2 98 %.Body mass index is 47.14 kg/m.  General Appearance: Fairly Groomed  Eye Contact:  Good  Speech:  Clear and Coherent  Volume:  Normal  Mood:  Anxious  Affect:  Appropriate, Congruent and Tearful  Thought Process:  Coherent and Goal Directed  Orientation:  Full (Time, Place, and Person)  Thought Content: Logical   Suicidal Thoughts:  No  Homicidal Thoughts:  No  Memory:  Immediate;   Good Recent;   Good Remote;   Good  Judgement:  Good  Insight:  Good  Psychomotor Activity:  Normal  Concentration:  Concentration: Good and Attention Span: Good  Recall:  Good  Fund of Knowledge: Good  Language: Good  Akathisia:  No  Handed:  Right  AIMS (if indicated): not done  Assets:  Communication Skills Desire for Improvement  ADL's:  Intact  Cognition: WNL  Sleep:  Fair   Screenings: PHQ2-9     Office Visit from 11/20/2017 in [facility]  PHQ-2 Total Score  0     Assessment and Plan:  MAYTHE DERAMO is a 32 y.o. year old female with a history of anxiety, marijuana use, irritable bowel syndrome. GERD, Spina bifida, intradural lipomas of s/p surgery, who is transferred from Dr. Lolly Mustache.   # GAD # MDD, moderate, recurrent without psychotic features Patient reports overall improvement in depression and anxiety since fluoxetine is started.  Although she may benefit from up titration of fluoxetine, she reports strong preference to stay on the current dose.  Will continue fluoxetine to target anxiety and depression; discussed with patient that will consider up titration at the next visit if she continues to have residual mood symptoms. Psychosocial stressors include chronic pain and recent surgery. She will greatly benefit from therapy; she is referred.  # Benzodiazepine use There is a concern from Dr. Lolly Mustache about the patient abusing benzodiazepine. Per chart  review, patient used to be on clonazepam at [facility], although it was later replaced to ativan (due to insurance issues per patient). Per PMP, no signs of over prescription, although she has  been getting clonazepam from her family. Discussed with patient regarding the risk of getting unprescribed medication. She agrees to discontinue ativan. Will restart clonazepam prn for anxiety. She agrees that clonazepam will not be continued if any signs of misuse or drug abuse. Discussed risk of dependence and oversedation. Will obtain drug test.   # marijuana use She has limited insight into marijuana use. She is at precontemplative stage for marijuana use. Will continue motivational interview.   1. Continue fluoxetine 20 mg daily  2. Restart clonazepam 1 mg at night as needed for anxiety 3. Discontinue ativan (has one refill left 0.5 mg BID) 3. Return to clinic in one month for 30 mins 4. Referral to therapy 5. Obtain urine drug test  The patient demonstrates the following  risk factors for suicide: Chronic risk factors for suicide include psychiatric disorder of anxiety. Acute risk factors for suicide include medical problems.   Protective factors for this patient include positive social support, coping skills, hope for the future, religious beliefs against suicide, life satisfaction.  Considering these factors, the overall suicide risk at this point appears to be low and is appropriate for outpatient follow up.   The duration of this appointment visit was 30 minutes of face-to-face time with the patient.  Greater than 50% of this time was spent in counseling, explanation of  diagnosis, planning of further management, and coordination of care.  Neysa Hotter, MD 01/18/2018, 10:33 AM

## 2018-01-15 NOTE — Telephone Encounter (Signed)
Please advise. Thank you   - S.  

## 2018-01-15 NOTE — Telephone Encounter (Signed)
Pt states that Dr Delton SeeNelson advised on the last  Visit that she would call these in for the Pt.Marland Kitchen.Marland Kitchen.Flexeril, and gabapentin, [facility] [address]

## 2018-01-15 NOTE — Telephone Encounter (Signed)
Prescriptions sent in per Dr.Nelson's instructions and patient notified with verbal understanding.  

## 2018-01-15 NOTE — Telephone Encounter (Signed)
May have gabapentin 300 mg, 2 tabs po TID for one month Flexeril 10 mg, one tab po BID prn for one month only

## 2018-01-18 ENCOUNTER — Ambulatory Visit (INDEPENDENT_AMBULATORY_CARE_PROVIDER_SITE_OTHER): Payer: BLUE CROSS/BLUE SHIELD | Admitting: Psychiatry

## 2018-01-18 ENCOUNTER — Encounter (HOSPITAL_COMMUNITY): Payer: Self-pay | Admitting: Psychiatry

## 2018-01-18 VITALS — BP 157/106 | HR 109 | Ht 67.0 in | Wt 301.0 lb

## 2018-01-18 DIAGNOSIS — Z87891 Personal history of nicotine dependence: Secondary | ICD-10-CM

## 2018-01-18 DIAGNOSIS — F1290 Cannabis use, unspecified, uncomplicated: Secondary | ICD-10-CM

## 2018-01-18 DIAGNOSIS — F1390 Sedative, hypnotic, or anxiolytic use, unspecified, uncomplicated: Secondary | ICD-10-CM | POA: Diagnosis not present

## 2018-01-18 DIAGNOSIS — R450 Nervousness: Secondary | ICD-10-CM

## 2018-01-18 DIAGNOSIS — F331 Major depressive disorder, recurrent, moderate: Secondary | ICD-10-CM

## 2018-01-18 DIAGNOSIS — G4700 Insomnia, unspecified: Secondary | ICD-10-CM | POA: Diagnosis not present

## 2018-01-18 DIAGNOSIS — Z818 Family history of other mental and behavioral disorders: Secondary | ICD-10-CM

## 2018-01-18 DIAGNOSIS — M549 Dorsalgia, unspecified: Secondary | ICD-10-CM

## 2018-01-18 DIAGNOSIS — Z560 Unemployment, unspecified: Secondary | ICD-10-CM

## 2018-01-18 DIAGNOSIS — F411 Generalized anxiety disorder: Secondary | ICD-10-CM | POA: Diagnosis not present

## 2018-01-18 MED ORDER — CLONAZEPAM 1 MG PO TABS: 1.0000 mg | ORAL_TABLET | Freq: Every evening | ORAL | 0 refills | Status: DC | PRN

## 2018-01-18 NOTE — Patient Instructions (Addendum)
1. Continue fluoxetine 20 mg daily  2. Restart clonazepam 1 mg at night  3. Return to clinic in one month for 30 mins 4. Referral to therapy 5. Obtain urine drug test, thyroid test

## 2018-01-21 LAB — DRUGS OF ABUSE SCREEN W/O ALC, ROUTINE URINE
AMPHETAMINES (1000 ng/mL SCRN): NEGATIVE
BARBITURATES: NEGATIVE
BENZODIAZEPINES: NEGATIVE
COCAINE METABOLITES: NEGATIVE
MARIJUANA MET (50 NG/ML SCRN): POSITIVE — AB
METHADONE: NEGATIVE
METHAQUALONE: NEGATIVE
OPIATES: NEGATIVE
PHENCYCLIDINE: NEGATIVE
PROPOXYPHENE: NEGATIVE

## 2018-01-21 LAB — TSH: TSH: 4.89 mIU/L — ABNORMAL HIGH

## 2018-01-24 ENCOUNTER — Encounter (HOSPITAL_COMMUNITY): Payer: Self-pay | Admitting: Licensed Clinical Social Worker

## 2018-01-24 ENCOUNTER — Ambulatory Visit (INDEPENDENT_AMBULATORY_CARE_PROVIDER_SITE_OTHER): Payer: BLUE CROSS/BLUE SHIELD | Admitting: Licensed Clinical Social Worker

## 2018-01-24 DIAGNOSIS — F411 Generalized anxiety disorder: Secondary | ICD-10-CM | POA: Diagnosis not present
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
## 2018-02-04 NOTE — Progress Notes (Signed)
Discussed with the patient regarding slightly elevated TSH. She will check free T4 at the next appointment with this note Clinical research associatewriter.

## 2018-02-12 ENCOUNTER — Ambulatory Visit (INDEPENDENT_AMBULATORY_CARE_PROVIDER_SITE_OTHER): Payer: BLUE CROSS/BLUE SHIELD | Admitting: Internal Medicine

## 2018-02-12 ENCOUNTER — Encounter (INDEPENDENT_AMBULATORY_CARE_PROVIDER_SITE_OTHER): Payer: Self-pay | Admitting: Internal Medicine

## 2018-02-12 VITALS — BP 140/78 | HR 100 | Temp 98.3°F | Resp 18 | Ht 67.0 in | Wt 305.7 lb

## 2018-02-12 DIAGNOSIS — K219 Gastro-esophageal reflux disease without esophagitis: Secondary | ICD-10-CM | POA: Diagnosis not present

## 2018-02-12 DIAGNOSIS — K589 Irritable bowel syndrome without diarrhea: Secondary | ICD-10-CM

## 2018-02-12 DIAGNOSIS — K582 Mixed irritable bowel syndrome: Secondary | ICD-10-CM

## 2018-02-12 MED ORDER — DICYCLOMINE HCL 10 MG PO CAPS: 10.0000 mg | ORAL_CAPSULE | Freq: Three times a day (TID) | ORAL | 5 refills | Status: DC | PRN

## 2018-02-12 NOTE — Patient Instructions (Addendum)
Can take dicyclomine 20 mg with meal if 10 mg does not control urgency and diarrhea.   Please call office for refills in 6 months.

## 2018-02-12 NOTE — Progress Notes (Signed)
Presenting complaint;  Follow-up for GERD and IBS.  Database and subjective:  Patient is 32 year old Caucasian female who is here for scheduled visit.  She was last seen on 08/14/2017 for GERD IBS and anxiety.  She was given prescription for lorazepam while she was waiting for psych evaluation.  Patient was maintained on a PPI and antispasmodic and advised to increase intake of fiber rich foods. She saw Dr. Vanetta Shawl earlier this month.  She is advised not to take lorazepam and she is not using Klonopin.  She is very pleased with the visit she had with Dr. Vanetta Shawl.  she feels she is doing well as present GERD symptoms concerned.  She rarely has heartburn.  The last time she had heartburn was when she forgot to take the PPI and had an episode which was intense.  She denies nausea vomiting or dysphagia.  She continues to complain of intermittent postprandial cramping across upper abdomen with urgency and diarrhea.  She is using dicyclomine on as-needed basis.  She states usually she takes first dose after breakfast.  She also complains of anorectal spasm 2-3 times a week.  When this occurs she has to stay still for a few minutes. She is worried about her weight gain.  She states she is watching her diet and has increased physical activity.  She has gained 13 pounds since her last visit.  She is planning to go on a keto diet.  She denies Fecal incontinence melena or rectal bleeding.  She is not having any side effects with dicyclomine. She states she sleeps during the daytime for 6 to 7 hours and stays up all night.   Current Medications: Outpatient Encounter Medications as of 02/12/2018  Medication Sig  . clonazePAM (KLONOPIN) 1 MG tablet Take 1 tablet (1 mg total) by mouth at bedtime as needed for anxiety.  . cyclobenzaprine (FLEXERIL) 10 MG tablet Take 1 tablet (10 mg total) by mouth 2 (two) times daily as needed.  . dicyclomine (BENTYL) 10 MG capsule Take 1 capsule (10 mg total) by mouth 3 (three)  times daily as needed for spasms.  Marland Kitchen FLUoxetine (PROZAC) 20 MG tablet Take 1 tablet (20 mg total) by mouth daily.  Marland Kitchen gabapentin (NEURONTIN) 300 MG capsule Take 2 capsules (600 mg total) by mouth 3 (three) times daily.  . Multiple Vitamin (MULTIVITAMIN WITH MINERALS) TABS tablet Take 1 tablet by mouth daily.  . Norgestimate-Ethinyl Estradiol Triphasic 0.18/0.215/0.25 MG-25 MCG tab Take by mouth.  . pantoprazole (PROTONIX) 40 MG tablet Take 1 tablet (40 mg total) by mouth daily.  . Wheat Dextrin (BENEFIBER DRINK MIX) PACK Take 4 g by mouth at bedtime.  Marland Kitchen LORazepam (ATIVAN) 0.5 MG tablet Take 1 tablet (0.5 mg total) by mouth 2 (two) times daily as needed for anxiety. (Patient not taking: Reported on 02/12/2018)   No facility-administered encounter medications on file as of 02/12/2018.      Objective: Blood pressure 140/78, pulse 100, temperature 98.3 F (36.8 C), temperature source Oral, resp. rate 18, height  (1.702 m), weight (!) 305 lb 11.2 oz (138.7 kg). Patient is alert and in no acute distress.   She has facial acne. Conjunctiva is pink. Sclera is nonicteric Oropharyngeal mucosa is normal. No neck masses or thyromegaly noted. Cardiac exam with regular rhythm normal S1 and S2. No murmur or gallop noted. Lungs are clear to auscultation. Abdomen is full but soft and nontender without organomegaly or masses. No LE edema or clubbing noted.    Assessment:  #  1.  GERD.  She is doing well with therapy.  Will consider dropping PPI dose when she is successful in losing some weight.  #2.  Irritable bowel syndrome.  She is not taking medicine correctly.  She needs to take dicyclomine at least 30 minutes before meal.  She is also having intermittent anorectal pain Which may be part and parcel of IBS or may be related to prior nerve injury resulting from spine surgery that she had for lipoma.    Plan:  Patient advised to take dicyclomine 10 or 20 mg before meal.  She can take second dose  if necessary. She will continue pantoprazole for now. Medication list updated.  Lorazepam deleted. New prescription for dicyclomine given. Office visit in 1 year.

## 2018-02-18 ENCOUNTER — Ambulatory Visit: Payer: BLUE CROSS/BLUE SHIELD | Admitting: Family Medicine

## 2018-02-18 ENCOUNTER — Other Ambulatory Visit: Payer: Self-pay | Admitting: Family Medicine
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
## 2018-02-22 ENCOUNTER — Encounter (HOSPITAL_COMMUNITY): Payer: Self-pay | Admitting: Psychiatry

## 2018-02-22 ENCOUNTER — Ambulatory Visit (INDEPENDENT_AMBULATORY_CARE_PROVIDER_SITE_OTHER): Payer: BLUE CROSS/BLUE SHIELD | Admitting: Psychiatry

## 2018-02-22 VITALS — BP 140/94 | HR 92 | Ht 67.0 in | Wt 304.0 lb

## 2018-02-22 DIAGNOSIS — Z87891 Personal history of nicotine dependence: Secondary | ICD-10-CM | POA: Diagnosis not present

## 2018-02-22 DIAGNOSIS — G4700 Insomnia, unspecified: Secondary | ICD-10-CM

## 2018-02-22 DIAGNOSIS — Z560 Unemployment, unspecified: Secondary | ICD-10-CM | POA: Diagnosis not present

## 2018-02-22 DIAGNOSIS — Z818 Family history of other mental and behavioral disorders: Secondary | ICD-10-CM

## 2018-02-22 DIAGNOSIS — R946 Abnormal results of thyroid function studies: Secondary | ICD-10-CM | POA: Diagnosis not present

## 2018-02-22 DIAGNOSIS — F1390 Sedative, hypnotic, or anxiolytic use, unspecified, uncomplicated: Secondary | ICD-10-CM

## 2018-02-22 DIAGNOSIS — R450 Nervousness: Secondary | ICD-10-CM

## 2018-02-22 DIAGNOSIS — F411 Generalized anxiety disorder: Secondary | ICD-10-CM | POA: Diagnosis not present

## 2018-02-22 DIAGNOSIS — F1290 Cannabis use, unspecified, uncomplicated: Secondary | ICD-10-CM

## 2018-02-22 DIAGNOSIS — F331 Major depressive disorder, recurrent, moderate: Secondary | ICD-10-CM | POA: Diagnosis not present

## 2018-02-22 LAB — T4, FREE: FREE T4: 0.9 ng/dL (ref 0.8–1.8)

## 2018-02-22 MED ORDER — CLONAZEPAM 1 MG PO TABS: 1.0000 mg | ORAL_TABLET | Freq: Every evening | ORAL | 0 refills | Status: DC | PRN

## 2018-02-22 MED ORDER — FLUOXETINE HCL 20 MG PO TABS: 40.0000 mg | ORAL_TABLET | Freq: Every day | ORAL | 0 refills | Status: DC

## 2018-02-22 NOTE — Patient Instructions (Signed)
1. Increase fluoxetine 40 mg daily  2. Continue clonazepam 1 mg at night as needed for anxiety  3. Return to clinic in one month for 30 mins 4. Check lab (free T4)

## 2018-02-25 ENCOUNTER — Encounter (HOSPITAL_COMMUNITY): Payer: Self-pay | Admitting: Psychiatry

## 2018-03-18 NOTE — Progress Notes (Signed)
BH MD/PA/NP OP Progress Note  03/22/2018 9:38 AM Hannah Lambert  MRN:  [id number]  Chief Complaint:  Chief Complaint    Anxiety; Follow-up     HPI:  Patient presents late for follow-up appointment for depression and anxiety.  She states that she has been feeling happier since the last appointment.  She does not feel numb as she used to.  She also states that her mother started to pay to her, although she slightly feels that her mother waited for the patient to ask her for it. She talked with her boyfriend the other day that she wants to have children. She is concerned that they are not intimate anymore. It attribute to her self esteem, although she states that she understands as she also does not like her appearance since gaining weight.  She sees some difference in him, referring that he is not close to his family. She feels that there are many things to work on as they have been together for 11 years. She reports improvement in insomnia.  She feels less fatigue.  She is more mindful for healthy eating.  She has fair concentration.  She denies SI.  She feels less anxious and tense.  She uses marijuana more than before Although she tries to decrease the dose of clonazepam, that caused her panic attacks.  She uses marijuana more than before.  She is unsure why she does it, although it used to be that her boyfriend uses it.   Wt Readings from Last 3 Encounters:  03/22/18 (!) 303 lb (137.4 kg)  02/22/18 (!) 304 lb (137.9 kg)  02/12/18 (!) 305 lb 11.2 oz (138.7 kg)   Per PMP,  Clonazepam filled on 02/22/2018    Visit Diagnosis:    ICD-10-CM   1. GAD (generalized anxiety disorder) F41.1   2. MDD (major depressive disorder), recurrent episode, moderate (HCC) F33.1     Past Psychiatric History: Please see initial evaluation for full details. I have reviewed the history. No updates at this time.     Past Medical History:  Past Medical History:  Diagnosis Date  . Anxiety   . Asthma    as  a child  . Benzodiazepine abuse (HCC) 01/15/2018  . Depression   . Family history of adverse reaction to anesthesia    post op nausea  . GERD (gastroesophageal reflux disease)   . Spina bifida occulta   . Tethered cord (HCC) 08/22/2016    Past Surgical History:  Procedure Laterality Date  . CHOLECYSTECTOMY N/A 12/29/2015   Procedure: LAPAROSCOPIC CHOLECYSTECTOMY WITH INTRAOPERATIVE CHOLANGIOGRAM;  Surgeon: Franky Macho, MD;  Location: [facility];  Service: General;  Laterality: N/A;  . DIAGNOSTIC LAPAROSCOPIC LIVER BIOPSY  12/29/2015   Procedure: LAPAROSCOPIC LIVER BIOPSY;  Surgeon: Franky Macho, MD;  Location: [facility];  Service: General;;  . SPINAL CORD UNTETHERING  05/16/2017  . SPINE SURGERY      Family Psychiatric History: Please see initial evaluation for full details. I have reviewed the history. No updates at this time.     Family History:  Family History  Problem Relation Age of Onset  . Depression Mother   . Diabetes Mother   . Hypertension Father   . Diabetes Father   . Kidney disease Father   . Hypertension Maternal Grandmother   . Mental illness Maternal Grandmother   . Mental illness Paternal Grandmother   . Heart disease Paternal Grandfather     Social History:  Social History   Socioeconomic History  .  Marital status: Significant Other    Spouse name: curtis  . Number of children: 0  . Years of education: 3714  . Highest education level: Associate degree: academic program  Occupational History  . Occupation: unemployed    Comment: courier  Social Needs  . Financial resource strain: Not hard at all  . Food insecurity:    Worry: Not on file    Inability: Not on file  . Transportation needs:    Medical: Not on file    Non-medical: Not on file  Tobacco Use  . Smoking status: Former Smoker    Types: Cigarettes    Last attempt to quit: 07/17/2015    Years since quitting: 2.6  . Smokeless tobacco: Never Used  . Tobacco comment: 1/4 ppd  Substance and Sexual  Activity  . Alcohol use: Yes    Alcohol/week: 0.0 oz    Comment: occasional  . Drug use: No  . Sexual activity: Yes    Birth control/protection: None  Lifestyle  . Physical activity:    Days per week: Not on file    Minutes per session: Not on file  . Stress: Not on file  Relationships  . Social connections:    Talks on phone: Not on file    Gets together: Not on file    Attends religious service: Not on file    Active member of club or organization: Not on file    Attends meetings of clubs or organizations: Not on file    Relationship status: Not on file  Other Topics Concern  . Not on file  Social History Narrative   Prior work as a [employer]   Now unemployed   Lives with mom and BF Lyda JesterCurtis   Leisure- "not a lot"    Allergies: No Known Allergies  Metabolic Disorder Labs: Lab Results  Component Value Date   HGBA1C 5.4 12/24/2015   MPG 108 12/24/2015   No results found for: PROLACTIN No results found for: CHOL, TRIG, HDL, CHOLHDL, VLDL, LDLCALC Lab Results  Component Value Date   TSH 4.89 (H) 01/18/2018   TSH 1.378 12/24/2015    Therapeutic Level Labs: No results found for: LITHIUM No results found for: VALPROATE No components found for:  CBMZ  Current Medications: Current Outpatient Medications  Medication Sig Dispense Refill  . clonazePAM (KLONOPIN) 1 MG tablet Take 1 tablet (1 mg total) by mouth at bedtime as needed for anxiety. 30 tablet 2  . cyclobenzaprine (FLEXERIL) 10 MG tablet Take 1 tablet (10 mg total) by mouth 2 (two) times daily as needed. 60 tablet 0  . dicyclomine (BENTYL) 10 MG capsule Take 1 capsule (10 mg total) by mouth 3 (three) times daily as needed for spasms. 90 capsule 5  . FLUoxetine (PROZAC) 20 MG tablet Take 2 tablets (40 mg total) by mouth daily. 180 tablet 0  . gabapentin (NEURONTIN) 300 MG capsule Take 2 capsules (600 mg total) by mouth 3 (three) times daily. 180 capsule 0  . Multiple Vitamin (MULTIVITAMIN WITH MINERALS) TABS tablet  Take 1 tablet by mouth daily.    . Norgestimate-Ethinyl Estradiol Triphasic 0.18/0.215/0.25 MG-25 MCG tab Take by mouth.    . pantoprazole (PROTONIX) 40 MG tablet Take 1 tablet (40 mg total) by mouth daily. 90 tablet 3  . Wheat Dextrin (BENEFIBER DRINK MIX) PACK Take 4 g by mouth at bedtime.     No current facility-administered medications for this visit.      Musculoskeletal: Strength & Muscle Tone: within normal  limits Gait & Station: normal Patient leans: N/A  Psychiatric Specialty Exam: Review of Systems  Psychiatric/Behavioral: Positive for depression. Negative for hallucinations, memory loss, substance abuse and suicidal ideas. The patient is nervous/anxious. The patient does not have insomnia.   All other systems reviewed and are negative.   Blood pressure (!) 140/94, pulse (!) 107, height 5\' 7"  (1.702 m), weight (!) 303 lb (137.4 kg), SpO2 96 %.Body mass index is 47.46 kg/m.  General Appearance: Fairly Groomed  Eye Contact:  Good  Speech:  Clear and Coherent  Volume:  Normal  Mood:  "better"  Affect:  Appropriate, Congruent and reactive  Thought Process:  Coherent  Orientation:  Full (Time, Place, and Person)  Thought Content: Logical   Suicidal Thoughts:  No  Homicidal Thoughts:  No  Memory:  Immediate;   Good  Judgement:  Good  Insight:  Fair  Psychomotor Activity:  Normal  Concentration:  Concentration: Good and Attention Span: Good  Recall:  Good  Fund of Knowledge: Good  Language: Good  Akathisia:  No  Handed:  Right  AIMS (if indicated): not done  Assets:  Communication Skills Desire for Improvement  ADL's:  Intact  Cognition: WNL  Sleep:  Good   Screenings: PHQ2-9     Office Visit from 11/20/2017 in [facility]  PHQ-2 Total Score  0       Assessment and Plan:  Hannah Lambert is a 32 y.o. year old female with a history of anxiety,  marijuana use, irritable bowel syndrome, GERD,Spina bifida, intradurallipomasofs/p surgery , who  presents for follow up appointment for GAD (generalized anxiety disorder)  MDD (major depressive disorder), recurrent episode, moderate (HCC)  Her boyfriend and her mother lives with her. She is unemployed.   # GAD # MDD, moderate, recurrent without psychotic features There has been overall improvement in neurovegetative symptoms and anxiety, which coincided with up titration of fluoxetine and improved relationship with her mother.  Psychosocial stressors including relationship issues. Will continue fluoxetine to target depression, anxiety. Will continue clonazepam prn for anxiety. Discussed risks, including, but not limited to somnolence and dependence. Discussed behavioral activation  # Marijuana use She is at contemplative stage for marijuana use and has escalated its use. Will continue motivational interview.   # elevated TSH Will check free T4. She will have an appointment with PCP.  Plan I have reviewed and updated plans as below 1  Continue fluoxetine 40 mg daily  2. Continue clonazepam 1 mg at night as needed for anxiety  3. Return to clinic in three months for 30 mins 4. Reviewed normal free T4; she was seen by PCP, who would do follow up  The patient demonstrates the following risk factors for suicide: Chronic risk factorsfor suicide include psychiatric disorder of anxiety.Acute risk factorsfor suicide include medical problems.Protective factorsfor this patient include positive social support,coping skills, hope for the future, religious beliefs against suicide, life satisfaction. Considering these factors, the overall suicide risk at this point appears to be low and is appropriate for outpatient follow up.  Past trials of medication:fluoxetine(since age 67), sertraline, fluoxetine, buspar,Wellbutrin,valium  The duration of this appointment visit was 30 minutes of face-to-face time with the patient.  Greater than 50% of this time was spent in counseling,  explanation of  diagnosis, planning of further management, and coordination of care.  Neysa Hotter, MD 03/22/2018, 9:38 AM

## 2018-03-22 ENCOUNTER — Ambulatory Visit (INDEPENDENT_AMBULATORY_CARE_PROVIDER_SITE_OTHER): Payer: BLUE CROSS/BLUE SHIELD | Admitting: Psychiatry

## 2018-03-22 VITALS — BP 140/94 | HR 107 | Ht 67.0 in | Wt 303.0 lb

## 2018-03-22 DIAGNOSIS — Z87891 Personal history of nicotine dependence: Secondary | ICD-10-CM

## 2018-03-22 DIAGNOSIS — Z560 Unemployment, unspecified: Secondary | ICD-10-CM

## 2018-03-22 DIAGNOSIS — R450 Nervousness: Secondary | ICD-10-CM | POA: Diagnosis not present

## 2018-03-22 DIAGNOSIS — Q059 Spina bifida, unspecified: Secondary | ICD-10-CM

## 2018-03-22 DIAGNOSIS — G4700 Insomnia, unspecified: Secondary | ICD-10-CM

## 2018-03-22 DIAGNOSIS — K219 Gastro-esophageal reflux disease without esophagitis: Secondary | ICD-10-CM

## 2018-03-22 DIAGNOSIS — F1099 Alcohol use, unspecified with unspecified alcohol-induced disorder: Secondary | ICD-10-CM | POA: Diagnosis not present

## 2018-03-22 DIAGNOSIS — Z638 Other specified problems related to primary support group: Secondary | ICD-10-CM | POA: Diagnosis not present

## 2018-03-22 DIAGNOSIS — R946 Abnormal results of thyroid function studies: Secondary | ICD-10-CM | POA: Diagnosis not present

## 2018-03-22 DIAGNOSIS — F411 Generalized anxiety disorder: Secondary | ICD-10-CM | POA: Diagnosis not present

## 2018-03-22 DIAGNOSIS — Z86018 Personal history of other benign neoplasm: Secondary | ICD-10-CM

## 2018-03-22 DIAGNOSIS — K589 Irritable bowel syndrome without diarrhea: Secondary | ICD-10-CM | POA: Diagnosis not present

## 2018-03-22 DIAGNOSIS — F331 Major depressive disorder, recurrent, moderate: Secondary | ICD-10-CM | POA: Diagnosis not present

## 2018-03-22 DIAGNOSIS — F1290 Cannabis use, unspecified, uncomplicated: Secondary | ICD-10-CM | POA: Diagnosis not present

## 2018-03-22 DIAGNOSIS — Z818 Family history of other mental and behavioral disorders: Secondary | ICD-10-CM

## 2018-03-22 MED ORDER — CLONAZEPAM 1 MG PO TABS: 1.0000 mg | ORAL_TABLET | Freq: Every evening | ORAL | 2 refills | Status: AC | PRN

## 2018-03-22 MED ORDER — FLUOXETINE HCL 20 MG PO TABS: 40.0000 mg | ORAL_TABLET | Freq: Every day | ORAL | 0 refills | Status: AC

## 2018-03-22 NOTE — Patient Instructions (Addendum)
1  Continue fluoxetine 40 mg daily  2. Continue clonazepam 1 mg at night as needed for anxiety  3. Return to clinic in one month for 30 mins

## 2018-06-18 NOTE — Progress Notes (Deleted)
BH MD/PA/NP OP Progress Note  06/18/2018 4:36 PM Hannah Lambert  MRN:  [id number]  Chief Complaint:  HPI: *** Visit Diagnosis: No diagnosis found.  Past Psychiatric History: Please see initial evaluation for full details. I have reviewed the history. No updates at this time.     Past Medical History:  Past Medical History:  Diagnosis Date  . Anxiety   . Asthma    as a child  . Benzodiazepine abuse (HCC) 01/15/2018  . Depression   . Family history of adverse reaction to anesthesia    post op nausea  . GERD (gastroesophageal reflux disease)   . Spina bifida occulta   . Tethered cord (HCC) 08/22/2016    Past Surgical History:  Procedure Laterality Date  . CHOLECYSTECTOMY N/A 12/29/2015   Procedure: LAPAROSCOPIC CHOLECYSTECTOMY WITH INTRAOPERATIVE CHOLANGIOGRAM;  Surgeon: Franky Macho, MD;  Location: [facility];  Service: General;  Laterality: N/A;  . DIAGNOSTIC LAPAROSCOPIC LIVER BIOPSY  12/29/2015   Procedure: LAPAROSCOPIC LIVER BIOPSY;  Surgeon: Franky Macho, MD;  Location: [facility];  Service: General;;  . SPINAL CORD UNTETHERING  05/16/2017  . SPINE SURGERY      Family Psychiatric History: Please see initial evaluation for full details. I have reviewed the history. No updates at this time.     Family History:  Family History  Problem Relation Age of Onset  . Depression Mother   . Diabetes Mother   . Hypertension Father   . Diabetes Father   . Kidney disease Father   . Hypertension Maternal Grandmother   . Mental illness Maternal Grandmother   . Mental illness Paternal Grandmother   . Heart disease Paternal Grandfather     Social History:  Social History   Socioeconomic History  . Marital status: Significant Other    Spouse name: curtis  . Number of children: 0  . Years of education: 45  . Highest education level: Associate degree: academic program  Occupational History  . Occupation: unemployed    Comment: courier  Social Needs  . Financial resource strain:  Not hard at all  . Food insecurity:    Worry: Not on file    Inability: Not on file  . Transportation needs:    Medical: Not on file    Non-medical: Not on file  Tobacco Use  . Smoking status: Former Smoker    Types: Cigarettes    Last attempt to quit: 07/17/2015    Years since quitting: 2.9  . Smokeless tobacco: Never Used  . Tobacco comment: 1/4 ppd  Substance and Sexual Activity  . Alcohol use: Yes    Alcohol/week: 0.0 standard drinks    Comment: occasional  . Drug use: No  . Sexual activity: Yes    Birth control/protection: None  Lifestyle  . Physical activity:    Days per week: Not on file    Minutes per session: Not on file  . Stress: Not on file  Relationships  . Social connections:    Talks on phone: Not on file    Gets together: Not on file    Attends religious service: Not on file    Active member of club or organization: Not on file    Attends meetings of clubs or organizations: Not on file    Relationship status: Not on file  Other Topics Concern  . Not on file  Social History Narrative   Prior work as a [employer] manager   Now unemployed   Lives with mom and BF [employer]  Leisure- "not a lot"    Allergies: No Known Allergies  Metabolic Disorder Labs: Lab Results  Component Value Date   HGBA1C 5.4 12/24/2015   MPG 108 12/24/2015   No results found for: PROLACTIN No results found for: CHOL, TRIG, HDL, CHOLHDL, VLDL, LDLCALC Lab Results  Component Value Date   TSH 4.89 (H) 01/18/2018   TSH 1.378 12/24/2015    Therapeutic Level Labs: No results found for: LITHIUM No results found for: VALPROATE No components found for:  CBMZ  Current Medications: Current Outpatient Medications  Medication Sig Dispense Refill  . clonazePAM (KLONOPIN) 1 MG tablet Take 1 tablet (1 mg total) by mouth at bedtime as needed for anxiety. 30 tablet 2  . cyclobenzaprine (FLEXERIL) 10 MG tablet Take 1 tablet (10 mg total) by mouth 2 (two) times daily as needed. 60 tablet 0  .  dicyclomine (BENTYL) 10 MG capsule Take 1 capsule (10 mg total) by mouth 3 (three) times daily as needed for spasms. 90 capsule 5  . FLUoxetine (PROZAC) 20 MG tablet Take 2 tablets (40 mg total) by mouth daily. 180 tablet 0  . gabapentin (NEURONTIN) 300 MG capsule Take 2 capsules (600 mg total) by mouth 3 (three) times daily. 180 capsule 0  . Multiple Vitamin (MULTIVITAMIN WITH MINERALS) TABS tablet Take 1 tablet by mouth daily.    . Norgestimate-Ethinyl Estradiol Triphasic 0.18/0.215/0.25 MG-25 MCG tab Take by mouth.    . pantoprazole (PROTONIX) 40 MG tablet Take 1 tablet (40 mg total) by mouth daily. 90 tablet 3  . Wheat Dextrin (BENEFIBER DRINK MIX) PACK Take 4 g by mouth at bedtime.     No current facility-administered medications for this visit.      Musculoskeletal: Strength & Muscle Tone: within normal limits Gait & Station: normal Patient leans: N/A  Psychiatric Specialty Exam: ROS  There were no vitals taken for this visit.There is no height or weight on file to calculate BMI.  General Appearance: Fairly Groomed  Eye Contact:  Good  Speech:  Clear and Coherent  Volume:  Normal  Mood:  {BHH MOOD:22306}  Affect:  {Affect (PAA):22687}  Thought Process:  Coherent  Orientation:  Full (Time, Place, and Person)  Thought Content: Logical   Suicidal Thoughts:  {ST/HT (PAA):22692}  Homicidal Thoughts:  {ST/HT (PAA):22692}  Memory:  Immediate;   Good  Judgement:  {Judgement (PAA):22694}  Insight:  {Insight (PAA):22695}  Psychomotor Activity:  Normal  Concentration:  Concentration: Good and Attention Span: Good  Recall:  Good  Fund of Knowledge: Good  Language: Good  Akathisia:  No  Handed:  Right  AIMS (if indicated): not done  Assets:  Communication Skills Desire for Improvement  ADL's:  Intact  Cognition: WNL  Sleep:  {BHH GOOD/FAIR/POOR:22877}   Screenings: PHQ2-9     Office Visit from 11/20/2017 in [facility]  PHQ-2 Total Score  0       Assessment  and Plan:  Hannah Lambert is a 32 y.o. year old female with a history of anxiety, marijuana use, irritable bowel syndrome,GERD,Spina bifida, intradurallipomasofs/p surgery , who presents for follow up appointment for No diagnosis found. Her boyfriend and her mother lives with her. She is unemployed.  # GAD # MDD, moderate, recurrent without psychotic features  There has been overall improvement in neurovegetative symptoms and anxiety, which coincided with up titration of fluoxetine and improved relationship with her mother.  Psychosocial stressors including relationship issues. Will continue fluoxetine to target depression, anxiety. Will continue clonazepam prn  for anxiety. Discussed risks, including, but not limited to somnolence and dependence. Discussed behavioral activation  # Marijuana use She is at contemplative stage for marijuana use and has escalated its use. Will continue motivational interview.   # elevated TSH Will check free T4. She will have an appointment with PCP.  Plan  1  Continue fluoxetine 40 mg daily  2. Continue clonazepam 1 mg at night as needed for anxiety  3.Return to clinic in three months for 30 mins 4. Reviewed normal free T4; she was seen by PCP, who would do follow up  The patient demonstrates the following risk factors for suicide: Chronic risk factorsfor suicide include psychiatric disorder of anxiety.Acute risk factorsfor suicide include medical problems.Protective factorsfor this patient include positive social support,coping skills, hope for the future, religious beliefs against suicide, life satisfaction. Considering these factors, the overall suicide risk at this point appears to be low and is appropriate for outpatient follow up.  Past trials of medication:fluoxetine(since age 82), sertraline, fluoxetine, buspar,Wellbutrin,valium  Neysa Hotter, MD 06/18/2018, 4:36 PM

## 2018-06-24 ENCOUNTER — Ambulatory Visit (HOSPITAL_COMMUNITY): Payer: Self-pay | Admitting: Psychiatry

## 2018-08-18 ENCOUNTER — Other Ambulatory Visit (INDEPENDENT_AMBULATORY_CARE_PROVIDER_SITE_OTHER): Payer: Self-pay | Admitting: Internal Medicine

## 2019-03-02 ENCOUNTER — Other Ambulatory Visit (INDEPENDENT_AMBULATORY_CARE_PROVIDER_SITE_OTHER): Payer: Self-pay | Admitting: Internal Medicine

## 2019-03-04 ENCOUNTER — Encounter (INDEPENDENT_AMBULATORY_CARE_PROVIDER_SITE_OTHER): Payer: Self-pay | Admitting: Internal Medicine

## 2019-03-04 ENCOUNTER — Ambulatory Visit (INDEPENDENT_AMBULATORY_CARE_PROVIDER_SITE_OTHER): Payer: BLUE CROSS/BLUE SHIELD | Admitting: Internal Medicine

## 2019-03-04 ENCOUNTER — Other Ambulatory Visit: Payer: Self-pay

## 2019-03-04 DIAGNOSIS — K219 Gastro-esophageal reflux disease without esophagitis: Secondary | ICD-10-CM | POA: Diagnosis not present

## 2019-03-04 DIAGNOSIS — K582 Mixed irritable bowel syndrome: Secondary | ICD-10-CM | POA: Diagnosis not present

## 2019-03-04 MED ORDER — DICYCLOMINE HCL 10 MG PO CAPS: 10.0000 mg | ORAL_CAPSULE | Freq: Three times a day (TID) | ORAL | 0 refills | Status: DC | PRN

## 2019-03-04 NOTE — Progress Notes (Signed)
Virtual Visit via Telephone Note  Patient had scheduled visit today but was canceled because of ongoing call for-19 pandemic.  Patient requested to proceed with tele-visit and I agreed. I connected with Hannah Lambert on 03/04/19 at  8:30 AM EDT by telephone and verified that I am speaking with the correct person using two identifiers.  Location: Patient: home Provider: office   I discussed the limitations, risks, security and privacy concerns of performing an evaluation and management service by telephone and the availability of in person appointments. I also discussed with the patient that there may be a patient responsible charge related to this service. The patient expressed understanding and agreed to proceed.   History of Present Illness:  Patient is 33 year old Caucasian female with history of GERD and IBS who was last seen in the office on 02/12/2018.  Patient states she is doing well as far as GERD symptoms are concerned.  She rarely has heartburn she also denies nausea vomiting or regurgitation.  She also denies dysphagia.  She is still having sporadic lower abdominal cramps for which she is using dicyclomine no more than once a week.  She generally has formed stool daily and she denies melena or rectal bleeding.  She has occasional vomiting which she states is triggered by lower back pain.  She says she is having episodic back pain.  She states she had an episode 2 days ago when she almost passed out.  She says when this occurred her heart rate goes up to 150 and she has cold sensation shooting through her abdomen.  She has not followed up with her neurologist regarding history of spinal cord lipoma as well as spina bifida occulta. Patient states she has not lost any weight.    Observations/Objective:  Patient appeared to be comfortable during tele-visit.  Assessment and Plan:  #1.  Chronic GERD.  She is doing well with antireflux measures and pantoprazole.  Prescription was  renewed yesterday.  No change in therapy plan at this time.  #2.  IBS with diarrhea as well as abdominal pain.  She is not having normal stools but still having some abdominal cramping controlled with dicyclomine.  She is also having sporadic abdominal pain in association with her back pain and that appears to be a separate issue over autonomic etiology.  Follow Up Instructions:  New prescription for dicyclomine sent to patient's pharmacy. She is up-to-date on her pantoprazole prescription. Next office visit in 1 year unless symptoms progress.  I discussed the assessment and treatment plan with the patient. The patient was provided an opportunity to ask questions and all were answered. The patient agreed with the plan and demonstrated an understanding of the instructions.   The patient was advised to call back or seek an in-person evaluation if the symptoms worsen or if the condition fails to improve as anticipated.  I provided 8 minutes of non-face-to-face time during this encounter.   Lionel December, MD

## 2019-03-05 NOTE — Patient Instructions (Signed)
Patient will call if GERD symptoms worsen or dicyclomine does not help with pain.

## 2019-09-12 ENCOUNTER — Other Ambulatory Visit (INDEPENDENT_AMBULATORY_CARE_PROVIDER_SITE_OTHER): Payer: Self-pay | Admitting: Internal Medicine

## 2019-11-18 ENCOUNTER — Other Ambulatory Visit (INDEPENDENT_AMBULATORY_CARE_PROVIDER_SITE_OTHER): Payer: Self-pay | Admitting: *Deleted

## 2019-11-18 MED ORDER — PANTOPRAZOLE SODIUM 40 MG PO TBEC: 40.0000 mg | DELAYED_RELEASE_TABLET | Freq: Every day | ORAL | 3 refills | Status: DC

## 2020-03-02 ENCOUNTER — Ambulatory Visit (INDEPENDENT_AMBULATORY_CARE_PROVIDER_SITE_OTHER): Payer: BLUE CROSS/BLUE SHIELD | Admitting: Internal Medicine

## 2020-05-12 ENCOUNTER — Other Ambulatory Visit (INDEPENDENT_AMBULATORY_CARE_PROVIDER_SITE_OTHER): Payer: Self-pay | Admitting: Internal Medicine

## 2020-05-12 DIAGNOSIS — K582 Mixed irritable bowel syndrome: Secondary | ICD-10-CM

## 2020-06-10 ENCOUNTER — Encounter: Payer: BC Managed Care – PPO | Admitting: Obstetrics & Gynecology

## 2020-10-02 ENCOUNTER — Other Ambulatory Visit: Payer: Self-pay

## 2020-10-02 ENCOUNTER — Ambulatory Visit
Admission: EM | Admit: 2020-10-02 | Discharge: 2020-10-02 | Disposition: A | Discharge status: Home / Self Care | Payer: BC Managed Care – PPO | Attending: Emergency Medicine | Admitting: Emergency Medicine

## 2020-10-02 ENCOUNTER — Encounter: Payer: Self-pay | Admitting: Emergency Medicine

## 2020-10-02 DIAGNOSIS — J069 Acute upper respiratory infection, unspecified: Secondary | ICD-10-CM | POA: Diagnosis not present

## 2020-10-02 DIAGNOSIS — Z1152 Encounter for screening for COVID-19: Secondary | ICD-10-CM | POA: Diagnosis not present

## 2020-10-02 DIAGNOSIS — J029 Acute pharyngitis, unspecified: Secondary | ICD-10-CM

## 2020-10-02 LAB — POCT RAPID STREP A (OFFICE): Rapid Strep A Screen: NEGATIVE

## 2020-10-02 MED ORDER — FLUTICASONE PROPIONATE 50 MCG/ACT NA SUSP: 1.0000 | Freq: Every day | NASAL | 0 refills | Status: AC

## 2020-10-02 MED ORDER — DEXAMETHASONE 4 MG PO TABS: 4.0000 mg | ORAL_TABLET | Freq: Every day | ORAL | 0 refills | Status: AC

## 2020-10-02 MED ORDER — BENZONATATE 100 MG PO CAPS: 100.0000 mg | ORAL_CAPSULE | Freq: Three times a day (TID) | ORAL | 0 refills | Status: AC

## 2020-10-02 MED ORDER — CETIRIZINE HCL 10 MG PO TABS: 10.0000 mg | ORAL_TABLET | Freq: Every day | ORAL | 0 refills | Status: AC

## 2020-10-02 NOTE — ED Provider Notes (Addendum)
[facility]   [id number] 10/02/20 Arrival Time: 1230   CC: COVID symptoms  SUBJECTIVE: History from: patient.  PESSY DELAMAR is a 34 y.o. female who presented to the urgent care for complaint of cough, nasal congestion, sore throat for the past 5 days.  Denies sick exposure to COVID, flu or strep.  Denies recent travel.  Has tried OTC medication without relief.  Denies alleviating or aggravating factors.  Denies previous symptoms in the past.   Denies fever, chills, fatigue, sinus pain, rhinorrhea, SOB, wheezing, chest pain, nausea, changes in bowel or bladder habits.    ROS: As per HPI.  All other pertinent ROS negative.      Past Medical History:  Diagnosis Date  . Anxiety   . Asthma    as a child  . Benzodiazepine abuse (HCC) 01/15/2018  . Depression   . Family history of adverse reaction to anesthesia    post op nausea  . GERD (gastroesophageal reflux disease)   . Spina bifida occulta   . Tethered cord (HCC) 08/22/2016   Past Surgical History:  Procedure Laterality Date  . CHOLECYSTECTOMY N/A 12/29/2015   Procedure: LAPAROSCOPIC CHOLECYSTECTOMY WITH INTRAOPERATIVE CHOLANGIOGRAM;  Surgeon: Franky Macho, MD;  Location: [facility];  Service: General;  Laterality: N/A;  . DIAGNOSTIC LAPAROSCOPIC LIVER BIOPSY  12/29/2015   Procedure: LAPAROSCOPIC LIVER BIOPSY;  Surgeon: Franky Macho, MD;  Location: [facility];  Service: General;;  . SPINAL CORD UNTETHERING  05/16/2017  . SPINE SURGERY     No Known Allergies No current facility-administered medications on file prior to encounter.   Current Outpatient Medications on File Prior to Encounter  Medication Sig Dispense Refill  . clonazePAM (KLONOPIN) 1 MG tablet Take 1 tablet (1 mg total) by mouth at bedtime as needed for anxiety. 30 tablet 2  . cyclobenzaprine (FLEXERIL) 10 MG tablet Take 1 tablet (10 mg total) by mouth 2 (two) times daily as needed. 60 tablet 0  . dicyclomine (BENTYL) 10 MG capsule Take 1 capsule (10 mg  total) by mouth 3 (three) times daily as needed for spasms. Need yearly office visit for refills. 90 capsule 0  . FLUoxetine (PROZAC) 20 MG tablet Take 2 tablets (40 mg total) by mouth daily. 180 tablet 0  . gabapentin (NEURONTIN) 300 MG capsule Take 2 capsules (600 mg total) by mouth 3 (three) times daily. 180 capsule 0  . Multiple Vitamin (MULTIVITAMIN WITH MINERALS) TABS tablet Take 1 tablet by mouth daily.    . Norgestimate-Ethinyl Estradiol Triphasic 0.18/0.215/0.25 MG-25 MCG tab Take by mouth.    . pantoprazole (PROTONIX) 40 MG tablet Take 1 tablet (40 mg total) by mouth daily. 90 tablet 3   Social History   Socioeconomic History  . Marital status: Significant Other    Spouse name: curtis  . Number of children: 0  . Years of education: 70  . Highest education level: Associate degree: academic program  Occupational History  . Occupation: unemployed    Comment: courier  Tobacco Use  . Smoking status: Former Smoker    Types: Cigarettes    Quit date: 07/17/2015    Years since quitting: 5.2  . Smokeless tobacco: Never Used  . Tobacco comment: 1/4 ppd  Vaping Use  . Vaping Use: Never used  Substance and Sexual Activity  . Alcohol use: Yes    Alcohol/week: 0.0 standard drinks    Comment: occasional  . Drug use: No  . Sexual activity: Yes    Birth control/protection: None  Other  Topics Concern  . Not on file  Social History Narrative   Prior work as a [employer] manager   Now unemployed   Lives with mom and BF Lyda Jester   Leisure- "not a lot"   Social Determinants of Corporate investment banker Strain: Not on file  Food Insecurity: Not on file  Transportation Needs: Not on file  Physical Activity: Not on file  Stress: Not on file  Social Connections: Not on file  Intimate Partner Violence: Not on file   Family History  Problem Relation Age of Onset  . Depression Mother   . Diabetes Mother   . Hypertension Father   . Diabetes Father   . Kidney disease Father   . Hypertension  Maternal Grandmother   . Mental illness Maternal Grandmother   . Mental illness Paternal Grandmother   . Heart disease Paternal Grandfather     OBJECTIVE:  Vitals:   10/02/20 1311 10/02/20 1314  BP:  (!) 134/93  Pulse:  (!) 115  Resp:  19  Temp:  99.1 F (37.3 C)  TempSrc:  Oral  SpO2:  95%  Weight: (!) 323 lb (146.5 kg)   Height: 5\' 7"  (1.702 m)      General appearance: alert; appears fatigued, but nontoxic; speaking in full sentences and tolerating own secretions HEENT: NCAT; Ears: EACs clear, TMs pearly gray; Eyes: PERRL.  EOM grossly intact. Sinuses: nontender; Nose: nares patent without rhinorrhea, Throat: oropharynx clear, tonsils non erythematous or enlarged, uvula midline  Neck: supple without LAD Lungs: unlabored respirations, symmetrical air entry; cough: moderate; no respiratory distress; CTAB Heart: regular rate and rhythm.  Radial pulses 2+ symmetrical bilaterally Skin: warm and dry Psychological: alert and cooperative; normal mood and affect  LABS:  Results for orders placed or performed during the hospital encounter of 10/02/20 (from the past 24 hour(s))  POCT rapid strep A     Status: None   Collection Time: 10/02/20  1:27 PM  Result Value Ref Range   Rapid Strep A Screen Negative Negative     ASSESSMENT & PLAN:  1. URI with cough and congestion   2. Sore throat   3. Encounter for screening for COVID-19     Meds ordered this encounter  Medications  . cetirizine (ZYRTEC ALLERGY) 10 MG tablet    Sig: Take 1 tablet (10 mg total) by mouth daily.    Dispense:  30 tablet    Refill:  0  . fluticasone (FLONASE) 50 MCG/ACT nasal spray    Sig: Place 1 spray into both nostrils daily for 14 days.    Dispense:  16 g    Refill:  0  . benzonatate (TESSALON) 100 MG capsule    Sig: Take 1 capsule (100 mg total) by mouth every 8 (eight) hours.    Dispense:  30 capsule    Refill:  0  . dexamethasone (DECADRON) 4 MG tablet    Sig: Take 1 tablet (4 mg total) by  mouth daily for 7 days.    Dispense:  7 tablet    Refill:  0    Discharge Instructions  Strep test was negative.  Sample will be sent for culture and someone will call if your result is positive.  COVID-19, flu A/B testing ordered.  It will take between 2-7 days for test results.  Someone will contact you regarding abnormal results.    In the meantime: You should remain isolated in your home for 10 days from symptom onset AND greater than 24  hours after symptoms resolution (absence of fever without the use of fever-reducing medication and improvement in respiratory symptoms), whichever is longer Get plenty of rest and push fluids Tessalon Perles prescribed for cough Zyrtec for nasal congestion, runny nose, and/or sore throat Flonase for nasal congestion and runny nose decadron was prescribed Use medications daily for symptom relief Use OTC medications like ibuprofen or tylenol as needed fever or pain Call or go to the ED if you have any new or worsening symptoms such as fever, worsening cough, shortness of breath, chest tightness, chest pain, turning blue, changes in mental status, etc...   Reviewed expectations re: course of current medical issues. Questions answered. Outlined signs and symptoms indicating need for more acute intervention. Patient verbalized understanding. After Visit Summary given.         Durward Parcel, FNP 10/02/20 1333    Durward Parcel, FNP 10/02/20 1333

## 2020-10-02 NOTE — ED Triage Notes (Addendum)
Cough, sinus congestion, sore throat x 5 days. Missed work yesterday, would like note for yesterday and tomorrow.

## 2020-10-02 NOTE — Discharge Instructions (Addendum)
Strep test was negative.  Sample will be sent for culture and someone will call if your result is positive.  COVID-19, flu A/B testing ordered.  It will take between 2-7 days for test results.  Someone will contact you regarding abnormal results.    In the meantime: You should remain isolated in your home for 10 days from symptom onset AND greater than 24 hours after symptoms resolution (absence of fever without the use of fever-reducing medication and improvement in respiratory symptoms), whichever is longer Get plenty of rest and push fluids Tessalon Perles prescribed for cough Zyrtec for nasal congestion, runny nose, and/or sore throat Flonase for nasal congestion and runny nose Decadron was prescribed Use medications daily for symptom relief Use OTC medications like ibuprofen or tylenol as needed fever or pain Call or go to the ED if you have any new or worsening symptoms such as fever, worsening cough, shortness of breath, chest tightness, chest pain, turning blue, changes in mental status, etc..Marland Kitchen

## 2020-10-04 LAB — CULTURE, GROUP A STREP (THRC)

## 2020-10-07 LAB — COVID-19, FLU A+B NAA
Influenza A, NAA: NOT DETECTED
Influenza B, NAA: NOT DETECTED
SARS-CoV-2, NAA: NOT DETECTED

## 2020-12-04 ENCOUNTER — Other Ambulatory Visit (INDEPENDENT_AMBULATORY_CARE_PROVIDER_SITE_OTHER): Payer: Self-pay | Admitting: Internal Medicine

## 2020-12-06 NOTE — Telephone Encounter (Signed)
Will refill medication for 3 months, needs follow up appointment with any provider in order to receive any refills as has not been seen in >1 year  Thanks,  Katrinka Blazing, MD Gastroenterology and Hepatology [facility]

## 2021-03-05 ENCOUNTER — Other Ambulatory Visit (INDEPENDENT_AMBULATORY_CARE_PROVIDER_SITE_OTHER): Payer: Self-pay | Admitting: Gastroenterology

## 2022-08-26 ENCOUNTER — Encounter (INDEPENDENT_AMBULATORY_CARE_PROVIDER_SITE_OTHER): Payer: Self-pay | Admitting: Gastroenterology

## 2022-09-15 DIAGNOSIS — Z419 Encounter for procedure for purposes other than remedying health state, unspecified: Secondary | ICD-10-CM | POA: Diagnosis not present

## 2022-10-16 DIAGNOSIS — Z419 Encounter for procedure for purposes other than remedying health state, unspecified: Secondary | ICD-10-CM | POA: Diagnosis not present

## 2022-11-16 DIAGNOSIS — Z419 Encounter for procedure for purposes other than remedying health state, unspecified: Secondary | ICD-10-CM | POA: Diagnosis not present

## 2022-12-15 DIAGNOSIS — Z419 Encounter for procedure for purposes other than remedying health state, unspecified: Secondary | ICD-10-CM | POA: Diagnosis not present

## 2023-01-15 DIAGNOSIS — Z419 Encounter for procedure for purposes other than remedying health state, unspecified: Secondary | ICD-10-CM | POA: Diagnosis not present

## 2023-02-14 DIAGNOSIS — Z419 Encounter for procedure for purposes other than remedying health state, unspecified: Secondary | ICD-10-CM | POA: Diagnosis not present

## 2023-03-17 DIAGNOSIS — Z419 Encounter for procedure for purposes other than remedying health state, unspecified: Secondary | ICD-10-CM | POA: Diagnosis not present

## 2023-04-16 DIAGNOSIS — Z419 Encounter for procedure for purposes other than remedying health state, unspecified: Secondary | ICD-10-CM | POA: Diagnosis not present

## 2023-05-17 DIAGNOSIS — Z419 Encounter for procedure for purposes other than remedying health state, unspecified: Secondary | ICD-10-CM | POA: Diagnosis not present

## 2023-06-17 DIAGNOSIS — Z419 Encounter for procedure for purposes other than remedying health state, unspecified: Secondary | ICD-10-CM | POA: Diagnosis not present

## 2023-07-17 DIAGNOSIS — Z419 Encounter for procedure for purposes other than remedying health state, unspecified: Secondary | ICD-10-CM | POA: Diagnosis not present

## 2023-08-17 DIAGNOSIS — Z419 Encounter for procedure for purposes other than remedying health state, unspecified: Secondary | ICD-10-CM | POA: Diagnosis not present

## 2023-09-16 DIAGNOSIS — Z419 Encounter for procedure for purposes other than remedying health state, unspecified: Secondary | ICD-10-CM | POA: Diagnosis not present

## 2023-10-17 DIAGNOSIS — Z419 Encounter for procedure for purposes other than remedying health state, unspecified: Secondary | ICD-10-CM | POA: Diagnosis not present

## 2023-11-17 DIAGNOSIS — Z419 Encounter for procedure for purposes other than remedying health state, unspecified: Secondary | ICD-10-CM | POA: Diagnosis not present

## 2023-12-15 DIAGNOSIS — Z419 Encounter for procedure for purposes other than remedying health state, unspecified: Secondary | ICD-10-CM | POA: Diagnosis not present

## 2024-01-26 DIAGNOSIS — Z419 Encounter for procedure for purposes other than remedying health state, unspecified: Secondary | ICD-10-CM | POA: Diagnosis not present

## 2024-02-25 DIAGNOSIS — Z419 Encounter for procedure for purposes other than remedying health state, unspecified: Secondary | ICD-10-CM | POA: Diagnosis not present

## 2024-03-27 DIAGNOSIS — Z419 Encounter for procedure for purposes other than remedying health state, unspecified: Secondary | ICD-10-CM | POA: Diagnosis not present

## 2024-04-26 DIAGNOSIS — Z419 Encounter for procedure for purposes other than remedying health state, unspecified: Secondary | ICD-10-CM | POA: Diagnosis not present

## 2024-05-27 DIAGNOSIS — Z419 Encounter for procedure for purposes other than remedying health state, unspecified: Secondary | ICD-10-CM | POA: Diagnosis not present

## 2024-06-27 DIAGNOSIS — Z419 Encounter for procedure for purposes other than remedying health state, unspecified: Secondary | ICD-10-CM | POA: Diagnosis not present

## 2024-07-27 DIAGNOSIS — Z419 Encounter for procedure for purposes other than remedying health state, unspecified: Secondary | ICD-10-CM | POA: Diagnosis not present

## 2024-08-27 DIAGNOSIS — Z419 Encounter for procedure for purposes other than remedying health state, unspecified: Secondary | ICD-10-CM | POA: Diagnosis not present
